# Patient Record
Sex: Female | Born: 1995 | Race: Black or African American | Hispanic: No | Marital: Single | Smoking: Current every day smoker
Health system: Southern US, Community
[De-identification: ages and names within clinical notes are randomized; demographics above are authoritative.]

## PROBLEM LIST (undated history)

## (undated) ENCOUNTER — Inpatient Hospital Stay (HOSPITAL_COMMUNITY): Payer: Self-pay

## (undated) DIAGNOSIS — Z789 Other specified health status: Secondary | ICD-10-CM

## (undated) HISTORY — PX: NO PAST SURGERIES: SHX2092

---
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
## 2016-03-21 LAB — OB RESULTS CONSOLE RUBELLA ANTIBODY, IGM: Rubella: IMMUNE

## 2016-03-21 LAB — OB RESULTS CONSOLE HEPATITIS B SURFACE ANTIGEN: Hepatitis B Surface Ag: NEGATIVE

## 2016-04-30 ENCOUNTER — Encounter (HOSPITAL_COMMUNITY): Payer: Self-pay

## 2016-04-30 ENCOUNTER — Inpatient Hospital Stay (HOSPITAL_COMMUNITY)
Admission: AD | Admit: 2016-04-30 | Discharge: 2016-04-30 | Disposition: A | Discharge status: Home / Self Care | Payer: Medicaid Other | Source: Ambulatory Visit | Attending: Obstetrics & Gynecology | Admitting: Obstetrics & Gynecology

## 2016-04-30 ENCOUNTER — Inpatient Hospital Stay (HOSPITAL_COMMUNITY): Payer: Medicaid Other

## 2016-04-30 DIAGNOSIS — O26893 Other specified pregnancy related conditions, third trimester: Secondary | ICD-10-CM | POA: Insufficient documentation

## 2016-04-30 DIAGNOSIS — Z3A16 16 weeks gestation of pregnancy: Secondary | ICD-10-CM

## 2016-04-30 DIAGNOSIS — Z3492 Encounter for supervision of normal pregnancy, unspecified, second trimester: Secondary | ICD-10-CM

## 2016-04-30 DIAGNOSIS — B3731 Acute candidiasis of vulva and vagina: Secondary | ICD-10-CM

## 2016-04-30 DIAGNOSIS — O26859 Spotting complicating pregnancy, unspecified trimester: Secondary | ICD-10-CM

## 2016-04-30 DIAGNOSIS — F17210 Nicotine dependence, cigarettes, uncomplicated: Secondary | ICD-10-CM | POA: Insufficient documentation

## 2016-04-30 DIAGNOSIS — B373 Candidiasis of vulva and vagina: Secondary | ICD-10-CM | POA: Insufficient documentation

## 2016-04-30 DIAGNOSIS — O26852 Spotting complicating pregnancy, second trimester: Secondary | ICD-10-CM | POA: Diagnosis not present

## 2016-04-30 DIAGNOSIS — Z3A15 15 weeks gestation of pregnancy: Secondary | ICD-10-CM | POA: Insufficient documentation

## 2016-04-30 DIAGNOSIS — Z6790 Unspecified blood type, Rh positive: Secondary | ICD-10-CM | POA: Diagnosis not present

## 2016-04-30 DIAGNOSIS — O98812 Other maternal infectious and parasitic diseases complicating pregnancy, second trimester: Secondary | ICD-10-CM | POA: Insufficient documentation

## 2016-04-30 DIAGNOSIS — O99332 Smoking (tobacco) complicating pregnancy, second trimester: Secondary | ICD-10-CM | POA: Diagnosis not present

## 2016-04-30 DIAGNOSIS — O209 Hemorrhage in early pregnancy, unspecified: Secondary | ICD-10-CM | POA: Diagnosis present

## 2016-04-30 HISTORY — DX: Other specified health status: Z78.9

## 2016-04-30 LAB — CBC
HEMATOCRIT: 33.7 % — AB (ref 36.0–46.0)
HEMOGLOBIN: 11.4 g/dL — AB (ref 12.0–15.0)
MCH: 30.5 pg (ref 26.0–34.0)
MCHC: 33.8 g/dL (ref 30.0–36.0)
MCV: 90.1 fL (ref 78.0–100.0)
Platelets: 260 10*3/uL (ref 150–400)
RBC: 3.74 MIL/uL — ABNORMAL LOW (ref 3.87–5.11)
RDW: 14.1 % (ref 11.5–15.5)
WBC: 11.8 10*3/uL — ABNORMAL HIGH (ref 4.0–10.5)

## 2016-04-30 LAB — URINALYSIS, ROUTINE W REFLEX MICROSCOPIC
Bilirubin Urine: NEGATIVE
Glucose, UA: NEGATIVE mg/dL
Hgb urine dipstick: NEGATIVE
Ketones, ur: NEGATIVE mg/dL
Nitrite: NEGATIVE
Protein, ur: NEGATIVE mg/dL
Specific Gravity, Urine: 1.02 (ref 1.005–1.030)
pH: 7 (ref 5.0–8.0)

## 2016-04-30 LAB — URINE MICROSCOPIC-ADD ON

## 2016-04-30 LAB — RAPID URINE DRUG SCREEN, HOSP PERFORMED
Amphetamines: NOT DETECTED
BARBITURATES: NOT DETECTED
BENZODIAZEPINES: NOT DETECTED
COCAINE: NOT DETECTED
Opiates: NOT DETECTED
TETRAHYDROCANNABINOL: POSITIVE — AB

## 2016-04-30 LAB — WET PREP, GENITAL
Clue Cells Wet Prep HPF POC: NONE SEEN
SPERM: NONE SEEN
Trich, Wet Prep: NONE SEEN

## 2016-04-30 LAB — POCT PREGNANCY, URINE: PREG TEST UR: POSITIVE — AB

## 2016-04-30 MED ORDER — TERCONAZOLE 0.4 % VA CREA: 1.0000 | TOPICAL_CREAM | Freq: Every day | VAGINAL | 0 refills | Status: DC

## 2016-04-30 NOTE — MAU Provider Note (Signed)
History     CSN: [id number]  Arrival date and time: 04/30/16 1914   None     Chief Complaint  Patient presents with  . Vaginal Bleeding   G1P0000 @15 .6 c/o pink spotting since yesterday. She reports IC the night before. She also c/o intermittent lower abdominal pain x3 days. She describes as feeling heaviness and rates pain 7/10. Activity brings on the pain and resting improves the pain. She took Tylenol and had some relief. She denies urinary sx. She denies vaginal discharge, itching, and odor. She had 2 prenatal visits in [place], last one being in August, and reports +chlamydia for which she was treated. She plans to stay here in [place] for remainder of pregnancy.     OB History    Gravida Para Term Preterm AB Living   1             SAB TAB Ectopic Multiple Live Births                  Past Medical History:  Diagnosis Date  . Medical history non-contributory     Past Surgical History:  Procedure Laterality Date  . NO PAST SURGERIES      No family history on file.  Social History  Substance Use Topics  . Smoking status: Current Every Day Smoker    Packs/day: 0.50  . Smokeless tobacco: Former NeurosurgeonUser  . Alcohol use No    Allergies:  Allergies  Allergen Reactions  . Chocolate   . Peanut-Containing Drug Products   . Strawberry Extract   . Tomato     No prescriptions prior to admission.    Review of Systems  Constitutional: Negative.   Gastrointestinal: Positive for abdominal pain.  Genitourinary: Negative.    Physical Exam   Blood pressure 104/55, pulse 80, temperature 97.9 F (36.6 C), temperature source Oral, resp. rate 18, last menstrual period 01/07/2016.  Physical Exam  Constitutional: She is oriented to person, place, and time. She appears well-developed and well-nourished.  HENT:  Head: Normocephalic and atraumatic.  Neck: Normal range of motion. Neck supple.  Cardiovascular: Normal rate.   Respiratory: Effort normal.  GI: Soft. She exhibits no  distension. There is no tenderness.  gravid  Genitourinary:  Genitourinary Comments: External: no lesions Vagina: rugated, nulli, white thick clumpy discharge SVE: closed/long  Musculoskeletal: Normal range of motion.  Neurological: She is alert and oriented to person, place, and time.  Skin: Skin is warm and dry.  Psychiatric: She has a normal mood and affect.   FHT: 152 bpm Results for orders placed or performed during the hospital encounter of 04/30/16 (from the past 24 hour(s))  Urinalysis, Routine w reflex microscopic (not at [facility])     Status: Abnormal   Collection Time: 04/30/16  7:45 PM  Result Value Ref Range   Color, Urine YELLOW YELLOW   APPearance CLEAR CLEAR   Specific Gravity, Urine 1.020 1.005 - 1.030   pH 7.0 5.0 - 8.0   Glucose, UA NEGATIVE NEGATIVE mg/dL   Hgb urine dipstick NEGATIVE NEGATIVE   Bilirubin Urine NEGATIVE NEGATIVE   Ketones, ur NEGATIVE NEGATIVE mg/dL   Protein, ur NEGATIVE NEGATIVE mg/dL   Nitrite NEGATIVE NEGATIVE   Leukocytes, UA TRACE (A) NEGATIVE  Urine microscopic-add on     Status: Abnormal   Collection Time: 04/30/16  7:45 PM  Result Value Ref Range   Squamous Epithelial / LPF 0-5 (A) NONE SEEN   WBC, UA 0-5 0 - 5 WBC/hpf  RBC / HPF 0-5 0 - 5 RBC/hpf   Bacteria, UA FEW (A) NONE SEEN   Urine-Other AMORPHOUS URATES/PHOSPHATES   Pregnancy, urine POC     Status: Abnormal   Collection Time: 04/30/16  8:06 PM  Result Value Ref Range   Preg Test, Ur POSITIVE (A) NEGATIVE  CBC     Status: Abnormal   Collection Time: 04/30/16  8:19 PM  Result Value Ref Range   WBC 11.8 (H) 4.0 - 10.5 K/uL   RBC 3.74 (L) 3.87 - 5.11 MIL/uL   Hemoglobin 11.4 (L) 12.0 - 15.0 g/dL   HCT 62.1 (L) 30.8 - 65.7 %   MCV 90.1 78.0 - 100.0 fL   MCH 30.5 26.0 - 34.0 pg   MCHC 33.8 30.0 - 36.0 g/dL   RDW 84.6 96.2 - 95.2 %   Platelets 260 150 - 400 K/uL  ABO/Rh     Status: None (Preliminary result)   Collection Time: 04/30/16  8:19 PM  Result Value Ref Range    ABO/RH(D) A POS   Wet prep, genital     Status: Abnormal   Collection Time: 04/30/16  8:40 PM  Result Value Ref Range   Yeast Wet Prep HPF POC PRESENT (A) NONE SEEN   Trich, Wet Prep NONE SEEN NONE SEEN   Clue Cells Wet Prep HPF POC NONE SEEN NONE SEEN   WBC, Wet Prep HPF POC FEW (A) NONE SEEN   Sperm NONE SEEN    [place]: posterior placenta, nml AFV, cervix closed MAU Course  Procedures  MDM [place] and [place] ordered and reviewed. No evidence of PTL, UTI or infection. Will treat YV. Pain likely physiologic to pregnancy. Spotting likely post-coital or from yeast. Stable for discharge home.  Assessment and Plan   1. Yeast vaginitis   2. Spotting affecting pregnancy in second trimester   3. Second trimester pregnancy   4. Spotting in pregnancy   5. Rh(D) positive    Discharge home Follow up with OB/GYN provider of choice-list provided Return for worsening sx  Donette Larry, CNM 04/30/2016, 8:17 PM

## 2016-04-30 NOTE — MAU Note (Signed)
Pt presents complaining of abdominal pain and vaginal bleeding that started yesterday. States the bleeding is spotting when she wipes that is brown. Last intercourse two days ago. LMP around 01/06/16. Denies other abnormal discharge.

## 2016-04-30 NOTE — Discharge Instructions (Signed)

## 2016-05-01 LAB — ABO/RH: ABO/RH(D): A POS

## 2016-05-02 LAB — GC/CHLAMYDIA PROBE AMP (~~LOC~~) NOT AT ARMC
Chlamydia: NEGATIVE
NEISSERIA GONORRHEA: NEGATIVE

## 2016-06-11 ENCOUNTER — Encounter: Payer: Self-pay | Admitting: Obstetrics and Gynecology

## 2016-06-11 ENCOUNTER — Ambulatory Visit (INDEPENDENT_AMBULATORY_CARE_PROVIDER_SITE_OTHER): Payer: Medicaid Other | Admitting: Obstetrics and Gynecology

## 2016-06-11 DIAGNOSIS — Z3400 Encounter for supervision of normal first pregnancy, unspecified trimester: Secondary | ICD-10-CM

## 2016-06-11 DIAGNOSIS — Z3402 Encounter for supervision of normal first pregnancy, second trimester: Secondary | ICD-10-CM

## 2016-06-11 DIAGNOSIS — Z202 Contact with and (suspected) exposure to infections with a predominantly sexual mode of transmission: Secondary | ICD-10-CM

## 2016-06-11 DIAGNOSIS — Z3490 Encounter for supervision of normal pregnancy, unspecified, unspecified trimester: Secondary | ICD-10-CM | POA: Insufficient documentation

## 2016-06-11 NOTE — Progress Notes (Signed)
Subjective:  Leah Pitts is a 20 y.o. G1P0 at 7365w2d being seen today for [facility] [place]. She has been recieivng OB care in [place]. 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  She desires STD testing today. U/S on 05/01/16 confirmed LMP for dates. She denies any chronic medical problems. This is her first pregnancy. FOB is not involved. She is currently monitored for the following issues for this low-risk pregnancy and has Supervision of normal pregnancy, antepartum and Possible exposure to STD on her problem list.  Patient reports no complaints.  Contractions: Not present. Vag. Bleeding: None.  Movement: Present. Denies leaking of fluid.   The following portions of the patient's history were reviewed and updated as appropriate: allergies, current medications, past family history, past medical history, past social history, past surgical history and problem list. Problem list updated.  Objective:   Vitals:   06/11/16 1344  BP: 95/65  Pulse: 76  Temp: 98.5 F (36.9 C)  Weight: 141 lb 9.6 oz (64.2 kg)    Fetal Status: Fetal Heart Rate (bpm): 141   Movement: Present     General:  Alert, oriented and cooperative. Patient is in no acute distress.  Skin: Skin is warm and dry. No rash noted.   Cardiovascular: Normal heart rate noted  Respiratory: Normal respiratory effort, no problems with respiration noted  Abdomen: Soft, gravid, appropriate for gestational age. Pain/Pressure: Absent     Pelvic:  Cervical exam deferred        Extremities: Normal range of motion.  Edema: Trace  Mental Status: Normal mood and affect. Normal behavior. Normal judgment and thought content.   Urinalysis:      Assessment and Plan:  Pregnancy: G1P0 at 6865w2d  1. Supervision of normal first pregnancy, antepartum Declines flu vaccine Quad screen today U/S for anantomy  2. Possible exposure to STD STD testing today  Preterm labor symptoms and general obstetric precautions including but not  limited to vaginal bleeding, contractions, leaking of fluid and fetal movement were reviewed in detail with the patient. Please refer to After Visit Summary for other counseling recommendations.  No Follow-up on file.   Hermina StaggersMichael L Arthur Aydelotte, MD

## 2016-06-11 NOTE — Patient Instructions (Signed)
Second Trimester of Pregnancy The second trimester is from week 13 through week 28 (months 4 through 6). The second trimester is often a time when you feel your best. Your body has also adjusted to being pregnant, and you begin to feel better physically. Usually, morning sickness has lessened or quit completely, you may have more energy, and you may have an increase in appetite. The second trimester is also a time when the fetus is growing rapidly. At the end of the sixth month, the fetus is about 9 inches long and weighs about 1 pounds. You will likely begin to feel the baby move (quickening) between 18 and 20 weeks of the pregnancy. Body changes during your second trimester Your body continues to go through many changes during your second trimester. The changes vary from woman to woman.  Your weight will continue to increase. You will notice your lower abdomen bulging out.  You may begin to get stretch marks on your hips, abdomen, and breasts.  You may develop headaches that can be relieved by medicines. The medicines should be approved by your health care provider.  You may urinate more often because the fetus is pressing on your bladder.  You may develop or continue to have heartburn as a result of your pregnancy.  You may develop constipation because certain hormones are causing the muscles that push waste through your intestines to slow down.  You may develop hemorrhoids or swollen, bulging veins (varicose veins).  You may have back pain. This is caused by:  Weight gain.  Pregnancy hormones that are relaxing the joints in your pelvis.  A shift in weight and the muscles that support your balance.  Your breasts will continue to grow and they will continue to become tender.  Your gums may bleed and may be sensitive to brushing and flossing.  Dark spots or blotches (chloasma, mask of pregnancy) may develop on your face. This will likely fade after the baby is born.  A dark line  from your belly button to the pubic area (linea nigra) may appear. This will likely fade after the baby is born.  You may have changes in your hair. These can include thickening of your hair, rapid growth, and changes in texture. Some women also have hair loss during or after pregnancy, or hair that feels dry or thin. Your hair will most likely return to normal after your baby is born. What to expect at prenatal visits During a routine prenatal visit:  You will be weighed to make sure you and the fetus are growing normally.  Your blood pressure will be taken.  Your abdomen will be measured to track your baby's growth.  The fetal heartbeat will be listened to.  Any test results from the previous visit will be discussed. Your health care provider may ask you:  How you are feeling.  If you are feeling the baby move.  If you have had any abnormal symptoms, such as leaking fluid, bleeding, severe headaches, or abdominal cramping.  If you are using any tobacco products, including cigarettes, chewing tobacco, and electronic cigarettes.  If you have any questions. Other tests that may be performed during your second trimester include:  Blood tests that check for:  Low iron levels (anemia).  Gestational diabetes (between 24 and 28 weeks).  Rh antibodies. This is to check for a protein on red blood cells (Rh factor).  Urine tests to check for infections, diabetes, or protein in the urine.  An ultrasound to   confirm the proper growth and development of the baby.  An amniocentesis to check for possible genetic problems.  Fetal screens for spina bifida and Down syndrome.  HIV (human immunodeficiency virus) testing. Routine prenatal testing includes screening for HIV, unless you choose not to have this test. Follow these instructions at home: Eating and drinking  Continue to eat regular, healthy meals.  Avoid raw meat, uncooked cheese, cat litter boxes, and soil used by cats. These  carry germs that can cause birth defects in the baby.  Take your prenatal vitamins.  Take 1500-2000 mg of calcium daily starting at the 20th week of pregnancy until you deliver your baby.  If you develop constipation:  Take over-the-counter or prescription medicines.  Drink enough fluid to keep your urine clear or pale yellow.  Eat foods that are high in fiber, such as fresh fruits and vegetables, whole grains, and beans.  Limit foods that are high in fat and processed sugars, such as fried and sweet foods. Activity  Exercise only as directed by your health care provider. Experiencing uterine cramps is a good sign to stop exercising.  Avoid heavy lifting, wear low heel shoes, and practice good posture.  Wear your seat belt at all times when driving.  Rest with your legs elevated if you have leg cramps or low back pain.  Wear a good support bra for breast tenderness.  Do not use hot tubs, steam rooms, or saunas. Lifestyle  Avoid all smoking, herbs, alcohol, and unprescribed drugs. These chemicals affect the formation and growth of the baby.  Do not use any products that contain nicotine or tobacco, such as cigarettes and e-cigarettes. If you need help quitting, ask your health care provider.  A sexual relationship may be continued unless your health care provider directs you otherwise. General instructions  Follow your health care provider's instructions regarding medicine use. There are medicines that are either safe or unsafe to take during pregnancy.  Take warm sitz baths to soothe any pain or discomfort caused by hemorrhoids. Use hemorrhoid cream if your health care provider approves.  If you develop varicose veins, wear support hose. Elevate your feet for 15 minutes, 3-4 times a day. Limit salt in your diet.  Visit your dentist if you have not gone yet during your pregnancy. Use a soft toothbrush to brush your teeth and be gentle when you floss.  Keep all follow-up  prenatal visits as told by your health care provider. This is important. Contact a health care provider if:  You have dizziness.  You have mild pelvic cramps, pelvic pressure, or nagging pain in the abdominal area.  You have persistent nausea, vomiting, or diarrhea.  You have a bad smelling vaginal discharge.  You have pain with urination. Get help right away if:  You have a fever.  You are leaking fluid from your vagina.  You have spotting or bleeding from your vagina.  You have severe abdominal cramping or pain.  You have rapid weight gain or weight loss.  You have shortness of breath with chest pain.  You notice sudden or extreme swelling of your face, hands, ankles, feet, or legs.  You have not felt your baby move in over an hour.  You have severe headaches that do not go away with medicine.  You have vision changes. Summary  The second trimester is from week 13 through week 28 (months 4 through 6). It is also a time when the fetus is growing rapidly.  Your body goes   through many changes during pregnancy. The changes vary from woman to woman.  Avoid all smoking, herbs, alcohol, and unprescribed drugs. These chemicals affect the formation and growth your baby.  Do not use any tobacco products, such as cigarettes, chewing tobacco, and e-cigarettes. If you need help quitting, ask your health care provider.  Contact your health care provider if you have any questions. Keep all prenatal visits as told by your health care provider. This is important. This information is not intended to replace advice given to you by your health care provider. Make sure you discuss any questions you have with your health care provider. Document Released: 06/24/2001 Document Revised: 12/06/2015 Document Reviewed: 08/31/2012 Elsevier Interactive Patient Education  2017 [employer]  

## 2016-06-11 NOTE — Progress Notes (Signed)
Patient states that she feels good today, reports good fetal movement. 

## 2016-06-13 ENCOUNTER — Encounter: Payer: Self-pay | Admitting: Obstetrics and Gynecology

## 2016-06-13 DIAGNOSIS — R768 Other specified abnormal immunological findings in serum: Secondary | ICD-10-CM | POA: Insufficient documentation

## 2016-06-13 LAB — AFP, QUAD SCREEN
DIA Mom Value: 0.89
DIA Value (EIA): 204.45 pg/mL
DSR (By Age)    1 IN: 1158
DSR (SECOND TRIMESTER) 1 IN: 10000
Gestational Age: 21.7 WEEKS
MSAFP MOM: 1.12
MSAFP: 88.9 ng/mL
MSHCG Mom: 0.64
MSHCG: 15128 m[IU]/mL
Maternal Age At EDD: 20.4 YEARS
Osb Risk: 10000
TEST RESULTS AFP: NEGATIVE
UE3 MOM: 1.83
WEIGHT: 141 [lb_av]
uE3 Value: 4.29 ng/mL

## 2016-06-13 LAB — HSV(HERPES SIMPLEX VRS) I + II AB-IGG
HSV 1 Glycoprotein G Ab, IgG: <0.91 index (ref 0.00–0.90)
HSV 2 Glycoprotein G Ab, IgG: 11.4 index — ABNORMAL HIGH (ref 0.00–0.90)

## 2016-06-14 LAB — CHLAMYDIA/GONOCOCCUS/TRICHOMONAS, NAA
CHLAMYDIA BY NAA: NEGATIVE
GONOCOCCUS BY NAA: NEGATIVE
Trich vag by NAA: NEGATIVE

## 2016-06-20 ENCOUNTER — Ambulatory Visit (HOSPITAL_COMMUNITY)
Admission: RE | Admit: 2016-06-20 | Discharge: 2016-06-20 | Disposition: A | Discharge status: Home / Self Care | Payer: Medicaid Other | Source: Ambulatory Visit | Attending: Obstetrics and Gynecology | Admitting: Obstetrics and Gynecology

## 2016-06-20 DIAGNOSIS — O99012 Anemia complicating pregnancy, second trimester: Secondary | ICD-10-CM | POA: Insufficient documentation

## 2016-06-20 DIAGNOSIS — D571 Sickle-cell disease without crisis: Secondary | ICD-10-CM | POA: Insufficient documentation

## 2016-06-20 DIAGNOSIS — Z3400 Encounter for supervision of normal first pregnancy, unspecified trimester: Secondary | ICD-10-CM

## 2016-06-20 DIAGNOSIS — Z3A23 23 weeks gestation of pregnancy: Secondary | ICD-10-CM | POA: Insufficient documentation

## 2016-07-03 ENCOUNTER — Inpatient Hospital Stay (HOSPITAL_COMMUNITY)
Admission: AD | Admit: 2016-07-03 | Discharge: 2016-07-03 | Disposition: A | Discharge status: Home / Self Care | Payer: Medicaid Other | Source: Ambulatory Visit | Attending: Obstetrics & Gynecology | Admitting: Obstetrics & Gynecology

## 2016-07-03 ENCOUNTER — Encounter (HOSPITAL_COMMUNITY): Payer: Self-pay

## 2016-07-03 DIAGNOSIS — Z87891 Personal history of nicotine dependence: Secondary | ICD-10-CM | POA: Insufficient documentation

## 2016-07-03 DIAGNOSIS — N930 Postcoital and contact bleeding: Secondary | ICD-10-CM

## 2016-07-03 DIAGNOSIS — A6000 Herpesviral infection of urogenital system, unspecified: Secondary | ICD-10-CM | POA: Diagnosis not present

## 2016-07-03 DIAGNOSIS — O4692 Antepartum hemorrhage, unspecified, second trimester: Secondary | ICD-10-CM | POA: Diagnosis not present

## 2016-07-03 DIAGNOSIS — O98312 Other infections with a predominantly sexual mode of transmission complicating pregnancy, second trimester: Secondary | ICD-10-CM | POA: Insufficient documentation

## 2016-07-03 DIAGNOSIS — Z3A25 25 weeks gestation of pregnancy: Secondary | ICD-10-CM | POA: Insufficient documentation

## 2016-07-03 LAB — URINALYSIS, MICROSCOPIC (REFLEX)

## 2016-07-03 LAB — URINALYSIS, ROUTINE W REFLEX MICROSCOPIC
Bilirubin Urine: NEGATIVE
Glucose, UA: NEGATIVE mg/dL
Ketones, ur: NEGATIVE mg/dL
NITRITE: NEGATIVE
PH: 7.5 (ref 5.0–8.0)
Protein, ur: 30 mg/dL — AB
SPECIFIC GRAVITY, URINE: 1.015 (ref 1.005–1.030)

## 2016-07-03 LAB — GC/CHLAMYDIA PROBE AMP (~~LOC~~) NOT AT ARMC
Chlamydia: NEGATIVE
Neisseria Gonorrhea: NEGATIVE

## 2016-07-03 LAB — WET PREP, GENITAL
Clue Cells Wet Prep HPF POC: NONE SEEN
SPERM: NONE SEEN
Trich, Wet Prep: NONE SEEN
YEAST WET PREP: NONE SEEN

## 2016-07-03 NOTE — MAU Provider Note (Signed)
History     CSN: [id number]  Arrival date and time: 07/03/16 [id number]    First Provider Initiated Contact with Patient 07/03/16 [phone]      Chief Complaint  Patient presents with  . Vaginal Bleeding   Patient is a 20 year old G1P0 at 25+3 who presents with complaint of vaginal bleeding. Around 0020, Experienced heavy bleeding when she got up to use the restroom. Felt an initial sharp pain when she got up, but it resolved. Had intercourse around 8-9pm. Reports intercourse was painful.  Last intercourse was 3-4 months ago, and she had bleeding then as well. But this time there was significantly more blood. No LOF, no contractions, good fetal movement.      OB History    Gravida Para Term Preterm AB Living   1             SAB TAB Ectopic Multiple Live Births                  Past Medical History:  Diagnosis Date  . Medical history non-contributory     Past Surgical History:  Procedure Laterality Date  . NO PAST SURGERIES      Family History  Problem Relation Age of Onset  . Diabetes Mother   . Hypertension Mother   . Diabetes Father   . Hypertension Father   . Diabetes Sister   . Hypertension Sister     Social History  Substance Use Topics  . Smoking status: Former Smoker    Packs/day: 0.50    Quit date: 05/13/2016  . Smokeless tobacco: Former NeurosurgeonUser  . Alcohol use No    Allergies:  Allergies  Allergen Reactions  . Peanut-Containing Drug Products Anaphylaxis  . Chocolate Rash  . Tomato Rash    Prescriptions Prior to Admission  Medication Sig Dispense Refill Last Dose  . acetaminophen (TYLENOL) 500 MG tablet Take 1,000 mg by mouth every 6 (six) hours as needed for mild pain, moderate pain or headache.   04/29/2016 at 1800  . cholecalciferol (VITAMIN D) 1000 units tablet Take 1,000 Units by mouth daily.   Taking  . Prenatal Vit-Fe Fumarate-FA (PRENATAL MULTIVITAMIN) TABS tablet Take 1 tablet by mouth daily.   Taking  . terconazole (TERAZOL 7) 0.4 % vaginal  cream Place 1 applicator vaginally at bedtime. 45 g 0     Review of Systems  Constitutional: Negative for chills and fever.  Genitourinary: Negative for dysuria.  Endo/Heme/Allergies: Does not bruise/bleed easily.   Physical Exam   Blood pressure 119/66, pulse 88, temperature 98.1 F (36.7 C), temperature source Oral, resp. rate 18, last menstrual period 01/07/2016.  Physical Exam  Constitutional: She appears well-developed and well-nourished.  Respiratory: Effort normal.  Genitourinary: Cervix exhibits friability.  Genitourinary Comments: Cervix closed and long    MAU Course  Procedures -silver nitrate swab  MDM   Assessment and Plan  Patient is a 20 year old G1P0 at 25+3 who presents with complaint of vaginal bleeding. Denied abdominal pain, contractions, or LOF. Speculum exam demonstrated clear cervical bleeding and only physiological discharge from os. Silver nitrate swab was applied. Patient advised to avoid intercourse for at least 1 week, and use caution when she does resume intercourse.  Clearance Cootsndrew Tyson 07/03/2016, 2:08 AM   I was present for and assisted with the exam and agree with above. Discussed Hx, exam, w/ Dr. Despina HiddenEure who agrees w/ POC. FHR reassuring for gestational age. No UC's.   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 Had requested STD testing. No Hx of genital lesions or other lesions C/W HSV. Informed pt of results. Recommend Valtrex at 34-36 weeks.   [place] Yuleimy Kretz, CNM 07/03/2016 9:24 AM

## 2016-07-03 NOTE — MAU Note (Signed)
Patient presents with vaginal bleeding that started 40 mins ago. Last had intercourse yesterday.

## 2016-07-03 NOTE — Addendum Note (Signed)
Addended by: Maretta BeesMCGLASHAN, Rosabel Sermeno J on: 07/03/2016 08:24 AM   Modules accepted: Orders

## 2016-07-04 ENCOUNTER — Encounter (HOSPITAL_COMMUNITY): Payer: Self-pay | Admitting: *Deleted

## 2016-07-04 ENCOUNTER — Inpatient Hospital Stay (HOSPITAL_COMMUNITY)
Admission: AD | Admit: 2016-07-04 | Discharge: 2016-07-04 | Disposition: A | Discharge status: Home / Self Care | Payer: Medicaid Other | Source: Ambulatory Visit | Attending: Obstetrics and Gynecology | Admitting: Obstetrics and Gynecology

## 2016-07-04 DIAGNOSIS — Z3A25 25 weeks gestation of pregnancy: Secondary | ICD-10-CM | POA: Insufficient documentation

## 2016-07-04 DIAGNOSIS — O209 Hemorrhage in early pregnancy, unspecified: Secondary | ICD-10-CM | POA: Insufficient documentation

## 2016-07-04 DIAGNOSIS — N888 Other specified noninflammatory disorders of cervix uteri: Secondary | ICD-10-CM | POA: Diagnosis not present

## 2016-07-04 DIAGNOSIS — Z87891 Personal history of nicotine dependence: Secondary | ICD-10-CM | POA: Diagnosis not present

## 2016-07-04 LAB — URINALYSIS, ROUTINE W REFLEX MICROSCOPIC
Bilirubin Urine: NEGATIVE
GLUCOSE, UA: NEGATIVE mg/dL
KETONES UR: NEGATIVE mg/dL
NITRITE: NEGATIVE
PH: 6 (ref 5.0–8.0)
Protein, ur: 30 mg/dL — AB
Specific Gravity, Urine: 1.025 (ref 1.005–1.030)

## 2016-07-04 MED ORDER — DOCUSATE SODIUM 100 MG PO CAPS: 100.0000 mg | ORAL_CAPSULE | Freq: Two times a day (BID) | ORAL | 2 refills | Status: DC | PRN

## 2016-07-04 NOTE — Discharge Instructions (Signed)
Vaginal Bleeding During Pregnancy, Second Trimester A small amount of bleeding (spotting) from the vagina is common in pregnancy. Sometimes the bleeding is normal and is not a problem, and sometimes it is a sign of something serious. Be sure to tell your doctor about any bleeding from your vagina right away. Follow these instructions at home:  Watch your condition for any changes.  Follow your doctor's instructions about how active you can be.  If you are on bed rest:  You may need to stay in bed and only get up to use the bathroom.  You may be allowed to do some activities.  If you need help, make plans for someone to help you.  Write down:  The number of pads you use each day.  How often you change pads.  How soaked (saturated) your pads are.  Do not use tampons.  Do not douche.  Do not have sex or orgasms until your doctor says it is okay.  If you pass any tissue from your vagina, save the tissue so you can show it to your doctor.  Only take medicines as told by your doctor.  Do not take aspirin because it can make you bleed.  Do not exercise, lift heavy weights, or do any activities that take a lot of energy and effort unless your doctor says it is okay.  Keep all follow-up visits as told by your doctor. Contact a doctor if:  You bleed from your vagina.  You have cramps.  You have labor pains.  You have a fever that does not go away after you take medicine. Get help right away if:  You have very bad cramps in your back or belly (abdomen).  You have contractions.  You have chills.  You pass large clots or tissue from your vagina.  You bleed more.  You feel light-headed or weak.  You pass out (faint).  You are leaking fluid or have a gush of fluid from your vagina. This information is not intended to replace advice given to you by your health care provider. Make sure you discuss any questions you have with your health care provider. Document  Released: 11/14/2013 Document Revised: 12/06/2015 Document Reviewed: 03/07/2013 Elsevier Interactive Patient Education  2017 Else[employer] Pelvic Rest Introduction Pelvic rest may be recommended if:  Your placenta is partially or completely covering the opening of your cervix (placenta previa).  There is bleeding between the wall of the uterus and the amniotic sac in the first trimester of pregnancy (subchorionic hemorrhage).  You went into labor too early (preterm labor). Based on your overall health and the health of your baby, your health care provider will decide if pelvic rest is right for you. How do I rest my pelvis? For as long as told by your health care provider:  Do not have sex, sexual stimulation, or an orgasm.  Do not use tampons. Do not douche. Do not put anything in your vagina.  Do not lift anything that is heavier than 10 lb (4.5 kg).  Avoid activities that take a lot of effort (are strenuous).  Avoid any activity in which your pelvic muscles could become strained. When should I seek medical care? Seek medical care if you have:  Cramping pain in your lower abdomen.  Vaginal discharge.  A low, dull backache.  Regular contractions.  Uterine tightening. When should I seek immediate medical care? Seek immediate medical care if:  You have vaginal bleeding and you are pregnant. This information is  not intended to replace advice given to you by your health care provider. Make sure you discuss any questions you have with your health care provider. Document Released: 10/25/2010 Document Revised: 12/06/2015 Document Reviewed: 01/01/2015  2017 Elsevier

## 2016-07-04 NOTE — MAU Provider Note (Signed)
Chief Complaint:  Vaginal Bleeding   First Provider Initiated Contact with Patient 07/04/16 1453     HPI: Leah Pitts is a 20 y.o. G1P0 at 5225w0dwho presents to maternity admissions reporting seeing drops of blood after BM today.  No pain. Was seen for this yesterday, deemed to be postcoital . She reports good fetal movement, denies LOF, vaginal bleeding, vaginal itching/burning, urinary symptoms, h/a, dizziness, n/v, diarrhea, constipation or fever/chills.  She denies headache, visual changes or RUQ abdominal pain.  Vaginal Bleeding  The patient's primary symptoms include vaginal bleeding. The patient's pertinent negatives include no genital itching, genital lesions, genital odor or pelvic pain. This is a recurrent problem. The current episode started yesterday. The problem occurs rarely. The problem has been resolved. The patient is experiencing no pain. The problem affects both sides. She is pregnant. Pertinent negatives include no back pain, chills, constipation, diarrhea, dysuria, fever, flank pain, nausea or vomiting. The vaginal discharge was bloody. The vaginal bleeding is spotting. She has not been passing clots. She has not been passing tissue. Nothing aggravates the symptoms. She has tried nothing for the symptoms.   RN Note: C/o vaginal bleeding that started this AM; this is the 2nd episode of bleeding this pregnancy; pt states that she was trying to have a bm and she noticed some blood in the toilet Past Medical History: Past Medical History:  Diagnosis Date  . Medical history non-contributory     Past obstetric history: OB History  Gravida Para Term Preterm AB Living  1            SAB TAB Ectopic Multiple Live Births               # Outcome Date GA Lbr Len/2nd Weight Sex Delivery Anes PTL Lv  1 Current               Past Surgical History: Past Surgical History:  Procedure Laterality Date  . NO PAST SURGERIES      Family History: Family History  Problem Relation  Age of Onset  . Diabetes Mother   . Hypertension Mother   . Diabetes Father   . Hypertension Father   . Diabetes Sister   . Hypertension Sister     Social History: Social History  Substance Use Topics  . Smoking status: Former Smoker    Packs/day: 0.50    Quit date: 05/13/2016  . Smokeless tobacco: Former NeurosurgeonUser  . Alcohol use No    Allergies:  Allergies  Allergen Reactions  . Peanut-Containing Drug Products Anaphylaxis  . Chocolate Rash  . Tomato Rash    Meds:  Prescriptions Prior to Admission  Medication Sig Dispense Refill Last Dose  . acetaminophen (TYLENOL) 500 MG tablet Take 1,000 mg by mouth every 6 (six) hours as needed for mild pain, moderate pain or headache.   Past Month at Unknown time  . cholecalciferol (VITAMIN D) 1000 units tablet Take 1,000 Units by mouth daily.   07/04/2016 at Unknown time  . Prenatal Vit-Fe Fumarate-FA (PRENATAL MULTIVITAMIN) TABS tablet Take 1 tablet by mouth daily.   07/04/2016 at Unknown time  . terconazole (TERAZOL 7) 0.4 % vaginal cream Place 1 applicator vaginally at bedtime. (Patient not taking: Reported on 07/04/2016) 45 g 0 Not Taking at Unknown time    I have reviewed patient's Past Medical Hx, Surgical Hx, Family Hx, Social Hx, medications and allergies.   ROS:  Review of Systems  Constitutional: Negative for chills and fever.  Gastrointestinal: Negative  for constipation, diarrhea, nausea and vomiting.  Genitourinary: Positive for vaginal bleeding. Negative for dysuria, flank pain and pelvic pain.  Musculoskeletal: Negative for back pain.   Other systems negative  Physical Exam  Patient Vitals for the past 24 hrs:  BP Temp Temp src Pulse Resp  07/04/16 1439 107/59 97.5 F (36.4 C) Axillary 87 16   Constitutional: Well-developed, well-nourished female in no acute distress.  Cardiovascular: normal rate and rhythm Respiratory: normal effort, clear to auscultation bilaterally GI: Abd soft, non-tender, gravid appropriate  for gestational age.   No rebound or guarding. MS: Extremities nontender, no edema, normal ROM Neurologic: Alert and oriented x 4.  GU: Neg CVAT.  PELVIC EXAM: Cervix pink, visually closed, without lesion, scant bloody discharge, Cervix friable vaginal walls and external genitalia normal  cervix long and closed  FHT:  Baseline 150 , moderate variability,  no decelerations Contractions: Rare   Labs: Results for orders placed or performed during the hospital encounter of 07/04/16 (from the past 24 hour(s))  Urinalysis, Routine w reflex microscopic     Status: Abnormal   Collection Time: 07/04/16  2:25 PM  Result Value Ref Range   Color, Urine YELLOW YELLOW   APPearance CLOUDY (A) CLEAR   Specific Gravity, Urine 1.025 1.005 - 1.030   pH 6.0 5.0 - 8.0   Glucose, UA NEGATIVE NEGATIVE mg/dL   Hgb urine dipstick LARGE (A) NEGATIVE   Bilirubin Urine NEGATIVE NEGATIVE   Ketones, ur NEGATIVE NEGATIVE mg/dL   Protein, ur 30 (A) NEGATIVE mg/dL   Nitrite NEGATIVE NEGATIVE   Leukocytes, UA LARGE (A) NEGATIVE   RBC / HPF 0-5 0 - 5 RBC/hpf   WBC, UA TOO NUMEROUS TO COUNT 0 - 5 WBC/hpf   Bacteria, UA RARE (A) NONE SEEN   Squamous Epithelial / LPF 6-30 (A) NONE SEEN   Mucous PRESENT    --/--/A POS (10/18 2019)  Imaging:  [place] Mfm Ob Comp + 14 Wk  Result Date: 06/20/2016 OBSTETRICAL ULTRASOUND: This exam was performed within a [facility]. The OB US report was generated in the AS system, and faxed to the ordering physician.  This report is available in the [facility] PACS. See the AS Obstetric US report via the Image Link.   MAU Course/MDM: I have ordered labs and reviewed results.  NST reviewed Discussed this friable cervix may continue to bleed at times Discussed pelvic rest   Assessment: 1. Bleeding of cervix     Plan: Discharge home Preterm Labor precautions and fetal kick counts Follow up in Office for prenatal visits and recheck of bleeding Bleeding  precautions  Follow-up Information    [facility] up.   Contact information: [address] [place] [phone]         Encouraged to return here or to other Urgent Care/ED if she develops worsening of symptoms, increase in pain, fever, or other concerning symptoms.  Pt stable at time of discharge.  Wynelle BourgeoisMarie Neleh Muldoon CNM, MSN Certified Nurse-Midwife 07/04/2016 3:19 PM

## 2016-07-04 NOTE — MAU Note (Signed)
C/o vaginal bleeding that started this AM; this is the 2nd episode of bleeding this pregnancy; pt states that she was trying to have a bm and she noticed some blood in the toilet;

## 2016-07-09 ENCOUNTER — Ambulatory Visit (INDEPENDENT_AMBULATORY_CARE_PROVIDER_SITE_OTHER): Payer: Medicaid Other | Admitting: Obstetrics and Gynecology

## 2016-07-09 ENCOUNTER — Other Ambulatory Visit: Payer: Medicaid Other

## 2016-07-09 VITALS — BP 113/77 | HR 67 | Wt 154.2 lb

## 2016-07-09 DIAGNOSIS — R768 Other specified abnormal immunological findings in serum: Secondary | ICD-10-CM

## 2016-07-09 DIAGNOSIS — Z3492 Encounter for supervision of normal pregnancy, unspecified, second trimester: Secondary | ICD-10-CM

## 2016-07-09 DIAGNOSIS — Z3490 Encounter for supervision of normal pregnancy, unspecified, unspecified trimester: Secondary | ICD-10-CM

## 2016-07-09 NOTE — Progress Notes (Signed)
Subjective:  Leah Pitts is a 20 y.o. G1P0 at 6117w5d being seen today for ongoing prenatal care.  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  Patient reports no complaints.  Contractions: Not present. Vag. Bleeding: None.  Movement: Present. Denies leaking of fluid.   The following portions of the patient's history were reviewed and updated as appropriate: allergies, current medications, past family history, past medical history, past social history, past surgical history and problem list. Problem list updated.  Objective:   Vitals:   07/09/16 0816  BP: 113/77  Pulse: 67  Weight: 154 lb 3.2 oz (69.9 kg)    Fetal Status: Fetal Heart Rate (bpm): 146 Fundal Height: 26 cm Movement: Present     General:  Alert, oriented and cooperative. Patient is in no acute distress.  Skin: Skin is warm and dry. No rash noted.   Cardiovascular: Normal heart rate noted  Respiratory: Normal respiratory effort, no problems with respiration noted  Abdomen: Soft, gravid, appropriate for gestational age. Pain/Pressure: Absent     Pelvic:  Cervical exam deferred        Extremities: Normal range of motion.     Mental Status: Normal mood and affect. Normal behavior. Normal judgment and thought content.   Urinalysis: Urine Protein: Negative Urine Glucose: Negative  Assessment and Plan:  Pregnancy: G1P0 at 1617w5d  1. Encounter for supervision of normal pregnancy, antepartum, unspecified gravidity F/U U/S 07/18/16 - Glucose Tolerance, 2 Hours w/1 Hour - CBC - HIV antibody (with reflex) - RPR  2. HSV-2 seropositive Suppressive treatment at 36 weeks  Preterm labor symptoms and general obstetric precautions including but not limited to vaginal bleeding, contractions, leaking of fluid and fetal movement were reviewed in detail with the patient. Please refer to After Visit  Summary for other counseling recommendations.  Return in about 3 weeks (around 07/30/2016) for OB visit.   Hermina StaggersMichael L Ervin, MD

## 2016-07-10 LAB — CBC
Hematocrit: 37.9 % (ref 34.0–46.6)
Hemoglobin: 12.8 g/dL (ref 11.1–15.9)
MCH: 31.3 pg (ref 26.6–33.0)
MCHC: 33.8 g/dL (ref 31.5–35.7)
MCV: 93 fL (ref 79–97)
PLATELETS: 269 10*3/uL (ref 150–379)
RBC: 4.09 x10E6/uL (ref 3.77–5.28)
RDW: 13.3 % (ref 12.3–15.4)
WBC: 11 10*3/uL — AB (ref 3.4–10.8)

## 2016-07-10 LAB — GLUCOSE TOLERANCE, 2 HOURS W/ 1HR
GLUCOSE, 2 HOUR: 87 mg/dL (ref 65–152)
GLUCOSE, FASTING: 77 mg/dL (ref 65–91)
Glucose, 1 hour: 84 mg/dL (ref 65–179)

## 2016-07-10 LAB — RPR: RPR: NONREACTIVE

## 2016-07-10 LAB — HIV ANTIBODY (ROUTINE TESTING W REFLEX): HIV SCREEN 4TH GENERATION: NONREACTIVE

## 2016-07-18 ENCOUNTER — Ambulatory Visit (HOSPITAL_COMMUNITY): Payer: Medicaid Other

## 2016-07-30 ENCOUNTER — Encounter: Payer: Medicaid Other | Admitting: Obstetrics and Gynecology

## 2016-08-06 ENCOUNTER — Other Ambulatory Visit: Payer: Self-pay | Admitting: Obstetrics and Gynecology

## 2016-08-06 ENCOUNTER — Ambulatory Visit (HOSPITAL_COMMUNITY)
Admission: RE | Admit: 2016-08-06 | Discharge: 2016-08-06 | Disposition: A | Discharge status: Home / Self Care | Payer: Medicaid Other | Source: Ambulatory Visit | Attending: Obstetrics and Gynecology | Admitting: Obstetrics and Gynecology

## 2016-08-06 DIAGNOSIS — Z362 Encounter for other antenatal screening follow-up: Secondary | ICD-10-CM | POA: Diagnosis present

## 2016-08-06 DIAGNOSIS — Z3A29 29 weeks gestation of pregnancy: Secondary | ICD-10-CM

## 2016-08-06 DIAGNOSIS — Z363 Encounter for antenatal screening for malformations: Secondary | ICD-10-CM

## 2016-08-06 DIAGNOSIS — Z3400 Encounter for supervision of normal first pregnancy, unspecified trimester: Secondary | ICD-10-CM

## 2016-08-13 ENCOUNTER — Ambulatory Visit (INDEPENDENT_AMBULATORY_CARE_PROVIDER_SITE_OTHER): Payer: Medicaid Other | Admitting: Obstetrics and Gynecology

## 2016-08-13 VITALS — BP 119/71 | HR 86 | Wt 158.0 lb

## 2016-08-13 DIAGNOSIS — Z3403 Encounter for supervision of normal first pregnancy, third trimester: Secondary | ICD-10-CM

## 2016-08-13 DIAGNOSIS — Z3400 Encounter for supervision of normal first pregnancy, unspecified trimester: Secondary | ICD-10-CM

## 2016-08-13 DIAGNOSIS — R768 Other specified abnormal immunological findings in serum: Secondary | ICD-10-CM

## 2016-08-13 NOTE — Progress Notes (Signed)
Subjective:  Leah Pitts is a 21 y.o. G1P0 at 582w5d being seen today for ongoing prenatal care.  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  Patient reports no complaints.  Contractions: Not present. Vag. Bleeding: None.  Movement: Present. Denies leaking of fluid.   The following portions of the patient's history were reviewed and updated as appropriate: allergies, current medications, past family history, past medical history, past social history, past surgical history and problem list. Problem list updated.  Objective:   Vitals:   08/13/16 1355  BP: 119/71  Pulse: 86  Weight: 158 lb (71.7 kg)    Fetal Status:     Movement: Present     General:  Alert, oriented and cooperative. Patient is in no acute distress.  Skin: Skin is warm and dry. No rash noted.   Cardiovascular: Normal heart rate noted  Respiratory: Normal respiratory effort, no problems with respiration noted  Abdomen: Soft, gravid, appropriate for gestational age. Pain/Pressure: Absent     Pelvic:  Cervical exam deferred        Extremities: Normal range of motion.  Edema: Trace  Mental Status: Normal mood and affect. Normal behavior. Normal judgment and thought content.   Urinalysis:      Assessment and Plan:  Pregnancy: G1P0 at 1082w5d  1. Supervision of normal first pregnancy, antepartum Tdap vaccine information provided  2. HSV-2 seropositive Tx at 34-36 wks  Preterm labor symptoms and general obstetric precautions including but not limited to vaginal bleeding, contractions, leaking of fluid and fetal movement were reviewed in detail with the patient. Please refer to After Visit Summary for other counseling recommendations.  Return in about 2 weeks (around 08/27/2016) for OB visit.   Hermina StaggersMichael L Dieter Hane, MD

## 2016-08-28 ENCOUNTER — Ambulatory Visit (INDEPENDENT_AMBULATORY_CARE_PROVIDER_SITE_OTHER): Payer: Medicaid Other | Admitting: Obstetrics & Gynecology

## 2016-08-28 DIAGNOSIS — Z3490 Encounter for supervision of normal pregnancy, unspecified, unspecified trimester: Secondary | ICD-10-CM

## 2016-08-28 DIAGNOSIS — Z3493 Encounter for supervision of normal pregnancy, unspecified, third trimester: Secondary | ICD-10-CM

## 2016-08-28 NOTE — Progress Notes (Signed)
   PRENATAL VISIT NOTE  Subjective:  Leah Pitts is a 21 y.o. G1P0 at 5628w6d being seen today for ongoing prenatal care.  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  Patient reports no complaints.  Contractions: Not present. Vag. Bleeding: None.  Movement: Present. Denies leaking of fluid.   The following portions of the patient's history were reviewed and updated as appropriate: allergies, current medications, past family history, past medical history, past social history, past surgical history and problem list. Problem list updated.  Objective:   Vitals:   08/28/16 1137  BP: 108/64  Pulse: 88  Weight: 165 lb 12.8 oz (75.2 kg)    Fetal Status: Fetal Heart Rate (bpm): 150 Fundal Height: 33 cm Movement: Present     General:  Alert, oriented and cooperative. Patient is in no acute distress.  Skin: Skin is warm and dry. No rash noted.   Cardiovascular: Normal heart rate noted  Respiratory: Normal respiratory effort, no problems with respiration noted  Abdomen: Soft, gravid, appropriate for gestational age. Pain/Pressure: Absent     Pelvic:  Cervical exam deferred        Extremities: Normal range of motion.  Edema: Trace  Mental Status: Normal mood and affect. Normal behavior. Normal judgment and thought content.   Assessment and Plan:  Pregnancy: G1P0 at 728w6d  1. Encounter for supervision of normal pregnancy, antepartum, unspecified gravidity Doing well  Preterm labor symptoms and general obstetric precautions including but not limited to vaginal bleeding, contractions, leaking of fluid and fetal movement were reviewed in detail with the patient. Please refer to After Visit Summary for other counseling recommendations.  Return in about 2 weeks (around 09/11/2016).   Adam PhenixJames G Arnold, MD

## 2016-08-28 NOTE — Patient Instructions (Signed)
Third Trimester of Pregnancy The third trimester is from week 29 through week 40 (months 7 through 9). The third trimester is a time when the unborn baby (fetus) is growing rapidly. At the end of the ninth month, the fetus is about 20 inches in length and weighs 6-10 pounds. Body changes during your third trimester Your body goes through many changes during pregnancy. The changes vary from woman to woman. During the third trimester:  Your weight will continue to increase. You can expect to gain 25-35 pounds (11-16 kg) by the end of the pregnancy.  You may begin to get stretch marks on your hips, abdomen, and breasts.  You may urinate more often because the fetus is moving lower into your pelvis and pressing on your bladder.  You may develop or continue to have heartburn. This is caused by increased hormones that slow down muscles in the digestive tract.  You may develop or continue to have constipation because increased hormones slow digestion and cause the muscles that push waste through your intestines to relax.  You may develop hemorrhoids. These are swollen veins (varicose veins) in the rectum that can itch or be painful.  You may develop swollen, bulging veins (varicose veins) in your legs.  You may have increased body aches in the pelvis, back, or thighs. This is due to weight gain and increased hormones that are relaxing your joints.  You may have changes in your hair. These can include thickening of your hair, rapid growth, and changes in texture. Some women also have hair loss during or after pregnancy, or hair that feels dry or thin. Your hair will most likely return to normal after your baby is born.  Your breasts will continue to grow and they will continue to become tender. A yellow fluid (colostrum) may leak from your breasts. This is the first milk you are producing for your baby.  Your belly button may stick out.  You may notice more swelling in your hands, face, or  ankles.  You may have increased tingling or numbness in your hands, arms, and legs. The skin on your belly may also feel numb.  You may feel short of breath because of your expanding uterus.  You may have more problems sleeping. This can be caused by the size of your belly, increased need to urinate, and an increase in your body's metabolism.  You may notice the fetus "dropping," or moving lower in your abdomen.  You may have increased vaginal discharge.  Your cervix becomes thin and soft (effaced) near your due date. What to expect at prenatal visits You will have prenatal exams every 2 weeks until week 36. Then you will have weekly prenatal exams. During a routine prenatal visit:  You will be weighed to make sure you and the fetus are growing normally.  Your blood pressure will be taken.  Your abdomen will be measured to track your baby's growth.  The fetal heartbeat will be listened to.  Any test results from the previous visit will be discussed.  You may have a cervical check near your due date to see if you have effaced. At around 36 weeks, your health care provider will check your cervix. At the same time, your health care provider will also perform a test on the secretions of the vaginal tissue. This test is to determine if a type of bacteria, Group B streptococcus, is present. Your health care provider will explain this further. Your health care provider may ask you:    What your birth plan is.  How you are feeling.  If you are feeling the baby move.  If you have had any abnormal symptoms, such as leaking fluid, bleeding, severe headaches, or abdominal cramping.  If you are using any tobacco products, including cigarettes, chewing tobacco, and electronic cigarettes.  If you have any questions. Other tests or screenings that may be performed during your third trimester include:  Blood tests that check for low iron levels (anemia).  Fetal testing to check the health,  activity level, and growth of the fetus. Testing is done if you have certain medical conditions or if there are problems during the pregnancy.  Nonstress test (NST). This test checks the health of your baby to make sure there are no signs of problems, such as the baby not getting enough oxygen. During this test, a belt is placed around your belly. The baby is made to move, and its heart rate is monitored during movement. What is false labor? False labor is a condition in which you feel small, irregular tightenings of the muscles in the womb (contractions) that eventually go away. These are called Braxton Hicks contractions. Contractions may last for hours, days, or even weeks before true labor sets in. If contractions come at regular intervals, become more frequent, increase in intensity, or become painful, you should see your health care provider. What are the signs of labor?  Abdominal cramps.  Regular contractions that start at 10 minutes apart and become stronger and more frequent with time.  Contractions that start on the top of the uterus and spread down to the lower abdomen and back.  Increased pelvic pressure and dull back pain.  A watery or bloody mucus discharge that comes from the vagina.  Leaking of amniotic fluid. This is also known as your "water breaking." It could be a slow trickle or a gush. Let your doctor know if it has a color or strange odor. If you have any of these signs, call your health care provider right away, even if it is before your due date. Follow these instructions at home: Eating and drinking  Continue to eat regular, healthy meals.  Do not eat:  Raw meat or meat spreads.  Unpasteurized milk or cheese.  Unpasteurized juice.  Store-made salad.  Refrigerated smoked seafood.  Hot dogs or deli meat, unless they are piping hot.  More than 6 ounces of albacore tuna a week.  Shark, swordfish, king mackerel, or tile fish.  Store-made salads.  Raw  sprouts, such as mung bean or alfalfa sprouts.  Take prenatal vitamins as told by your health care provider.  Take 1000 mg of calcium daily as told by your health care provider.  If you develop constipation:  Take over-the-counter or prescription medicines.  Drink enough fluid to keep your urine clear or pale yellow.  Eat foods that are high in fiber, such as fresh fruits and vegetables, whole grains, and beans.  Limit foods that are high in fat and processed sugars, such as fried and sweet foods. Activity  Exercise only as directed by your health care provider. Healthy pregnant women should aim for 2 hours and 30 minutes of moderate exercise per week. If you experience any pain or discomfort while exercising, stop.  Avoid heavy lifting.  Do not exercise in extreme heat or humidity, or at high altitudes.  Wear low-heel, comfortable shoes.  Practice good posture.  Do not travel far distances unless it is absolutely necessary and only with the approval   of your health care provider.  Wear your seat belt at all times while in a car, on a bus, or on a plane.  Take frequent breaks and rest with your legs elevated if you have leg cramps or low back pain.  Do not use hot tubs, steam rooms, or saunas.  You may continue to have sex unless your health care provider tells you otherwise. Lifestyle  Do not use any products that contain nicotine or tobacco, such as cigarettes and e-cigarettes. If you need help quitting, ask your health care provider.  Do not drink alcohol.  Do not use any medicinal herbs or unprescribed drugs. These chemicals affect the formation and growth of the baby.  If you develop varicose veins:  Wear support pantyhose or compression stockings as told by your healthcare provider.  Elevate your feet for 15 minutes, 3-4 times a day.  Wear a supportive maternity bra to help with breast tenderness. General instructions  Take over-the-counter and prescription  medicines only as told by your health care provider. There are medicines that are either safe or unsafe to take during pregnancy.  Take warm sitz baths to soothe any pain or discomfort caused by hemorrhoids. Use hemorrhoid cream or witch hazel if your health care provider approves.  Avoid cat litter boxes and soil used by cats. These carry germs that can cause birth defects in the baby. If you have a cat, ask someone to clean the litter box for you.  To prepare for the arrival of your baby:  Take prenatal classes to understand, practice, and ask questions about the labor and delivery.  Make a trial run to the hospital.  Visit the hospital and tour the maternity area.  Arrange for maternity or paternity leave through employers.  Arrange for family and friends to take care of pets while you are in the hospital.  Purchase a rear-facing car seat and make sure you know how to install it in your car.  Pack your hospital bag.  Prepare the baby's nursery. Make sure to remove all pillows and stuffed animals from the baby's crib to prevent suffocation.  Visit your dentist if you have not gone during your pregnancy. Use a soft toothbrush to brush your teeth and be gentle when you floss.  Keep all prenatal follow-up visits as told by your health care provider. This is important. Contact a health care provider if:  You are unsure if you are in labor or if your water has broken.  You become dizzy.  You have mild pelvic cramps, pelvic pressure, or nagging pain in your abdominal area.  You have lower back pain.  You have persistent nausea, vomiting, or diarrhea.  You have an unusual or bad smelling vaginal discharge.  You have pain when you urinate. Get help right away if:  You have a fever.  You are leaking fluid from your vagina.  You have spotting or bleeding from your vagina.  You have severe abdominal pain or cramping.  You have rapid weight loss or weight gain.  You have  shortness of breath with chest pain.  You notice sudden or extreme swelling of your face, hands, ankles, feet, or legs.  Your baby makes fewer than 10 movements in 2 hours.  You have severe headaches that do not go away with medicine.  You have vision changes. Summary  The third trimester is from week 29 through week 40, months 7 through 9. The third trimester is a time when the unborn baby (fetus)   is growing rapidly.  During the third trimester, your discomfort may increase as you and your baby continue to gain weight. You may have abdominal, leg, and back pain, sleeping problems, and an increased need to urinate.  During the third trimester your breasts will keep growing and they will continue to become tender. A yellow fluid (colostrum) may leak from your breasts. This is the first milk you are producing for your baby.  False labor is a condition in which you feel small, irregular tightenings of the muscles in the womb (contractions) that eventually go away. These are called Braxton Hicks contractions. Contractions may last for hours, days, or even weeks before true labor sets in.  Signs of labor can include: abdominal cramps; regular contractions that start at 10 minutes apart and become stronger and more frequent with time; watery or bloody mucus discharge that comes from the vagina; increased pelvic pressure and dull back pain; and leaking of amniotic fluid. This information is not intended to replace advice given to you by your health care provider. Make sure you discuss any questions you have with your health care provider. Document Released: 06/24/2001 Document Revised: 12/06/2015 Document Reviewed: 08/31/2012 Elsevier Interactive Patient Education  2017 [employer]  

## 2016-09-15 ENCOUNTER — Other Ambulatory Visit (HOSPITAL_COMMUNITY)
Admission: RE | Admit: 2016-09-15 | Discharge: 2016-09-15 | Disposition: A | Discharge status: Home / Self Care | Payer: Medicaid Other | Source: Ambulatory Visit | Attending: Obstetrics & Gynecology | Admitting: Obstetrics & Gynecology

## 2016-09-15 ENCOUNTER — Ambulatory Visit (INDEPENDENT_AMBULATORY_CARE_PROVIDER_SITE_OTHER): Payer: Medicaid Other | Admitting: Obstetrics & Gynecology

## 2016-09-15 VITALS — BP 121/87 | HR 80 | Wt 165.0 lb

## 2016-09-15 DIAGNOSIS — Z113 Encounter for screening for infections with a predominantly sexual mode of transmission: Secondary | ICD-10-CM | POA: Diagnosis not present

## 2016-09-15 DIAGNOSIS — Z3403 Encounter for supervision of normal first pregnancy, third trimester: Secondary | ICD-10-CM

## 2016-09-15 DIAGNOSIS — Z3400 Encounter for supervision of normal first pregnancy, unspecified trimester: Secondary | ICD-10-CM

## 2016-09-15 DIAGNOSIS — R768 Other specified abnormal immunological findings in serum: Secondary | ICD-10-CM

## 2016-09-15 MED ORDER — VALACYCLOVIR HCL 500 MG PO TABS: 1000.0000 mg | ORAL_TABLET | Freq: Two times a day (BID) | ORAL | 1 refills | Status: AC

## 2016-09-15 NOTE — Progress Notes (Signed)
   PRENATAL VISIT NOTE  Subjective:  Leah Pitts is a 21 y.o. G1P0 at 7268w3d being seen today for ongoing prenatal care.  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  Patient reports no complaints.  Contractions: Not present. Vag. Bleeding: None.  Movement: Present. Denies leaking of fluid.   The following portions of the patient's history were reviewed and updated as appropriate: allergies, current medications, past family history, past medical history, past social history, past surgical history and problem list. Problem list updated.  Objective:   Vitals:   09/15/16 0858  BP: 121/87  Pulse: 80  Weight: 74.8 kg (165 lb)    Fetal Status: Fetal Heart Rate (bpm): 132   Movement: Present     General:  Alert, oriented and cooperative. Patient is in no acute distress.  Skin: Skin is warm and dry. No rash noted.   Cardiovascular: Normal heart rate noted  Respiratory: Normal respiratory effort, no problems with respiration noted  Abdomen: Soft, gravid, appropriate for gestational age. Pain/Pressure: Present     Pelvic:  Cervical exam performed        Extremities: Normal range of motion.     Mental Status: Normal mood and affect. Normal behavior. Normal judgment and thought content.   Assessment and Plan:  Pregnancy: G1P0 at 4568w3d  1. HSV-2 seropositive - start Valtrex   2. Supervision of normal first pregnancy, antepartum  - Strep Gp B NAA - Cervicovaginal ancillary only  Preterm labor symptoms and general obstetric precautions including but not limited to vaginal bleeding, contractions, leaking of fluid and fetal movement were reviewed in detail with the patient. Please refer to After Visit Summary for other counseling recommendations.  Return in about 1 week (around 09/22/2016).   Allie BossierMyra C Jameelah Watts, MD

## 2016-09-15 NOTE — Progress Notes (Signed)
Pt is to start on Valtrex. Pt states she needs Rx sent to pharmacy.

## 2016-09-17 LAB — CERVICOVAGINAL ANCILLARY ONLY
CHLAMYDIA, DNA PROBE: NEGATIVE
NEISSERIA GONORRHEA: NEGATIVE

## 2016-09-17 LAB — STREP GP B NAA: STREP GROUP B AG: POSITIVE — AB

## 2016-09-21 ENCOUNTER — Encounter: Payer: Self-pay | Admitting: Obstetrics & Gynecology

## 2016-09-21 DIAGNOSIS — O99820 Streptococcus B carrier state complicating pregnancy: Secondary | ICD-10-CM | POA: Insufficient documentation

## 2016-09-23 ENCOUNTER — Ambulatory Visit (INDEPENDENT_AMBULATORY_CARE_PROVIDER_SITE_OTHER): Payer: Medicaid Other | Admitting: Certified Nurse Midwife

## 2016-09-23 VITALS — BP 107/70 | HR 90 | Wt 167.6 lb

## 2016-09-23 DIAGNOSIS — Z3403 Encounter for supervision of normal first pregnancy, third trimester: Secondary | ICD-10-CM

## 2016-09-23 DIAGNOSIS — R768 Other specified abnormal immunological findings in serum: Secondary | ICD-10-CM

## 2016-09-23 DIAGNOSIS — Z3400 Encounter for supervision of normal first pregnancy, unspecified trimester: Secondary | ICD-10-CM

## 2016-09-23 DIAGNOSIS — O99820 Streptococcus B carrier state complicating pregnancy: Secondary | ICD-10-CM

## 2016-09-23 NOTE — Progress Notes (Signed)
   PRENATAL VISIT NOTE  Subjective:  Rada HayDoranna M Cairns is a 21 y.o. G1P0 at 630w4d being seen today for ongoing prenatal care.  She is currently monitored for the following issues for this low-risk pregnancy and has Supervision of normal pregnancy, antepartum; HSV-2 seropositive; and GBS (group B Streptococcus carrier), +RV culture, currently pregnant on her problem list.  Patient reports no complaints.  Contractions: Not present. Vag. Bleeding: None.  Movement: Present. Denies leaking of fluid.   The following portions of the patient's history were reviewed and updated as appropriate: allergies, current medications, past family history, past medical history, past social history, past surgical history and problem list. Problem list updated.  Objective:   Vitals:   09/23/16 1030  BP: 107/70  Pulse: 90  Weight: 167 lb 9.6 oz (76 kg)    Fetal Status: Fetal Heart Rate (bpm): 143 Fundal Height: 36 cm Movement: Present     General:  Alert, oriented and cooperative. Patient is in no acute distress.  Skin: Skin is warm and dry. No rash noted.   Cardiovascular: Normal heart rate noted  Respiratory: Normal respiratory effort, no problems with respiration noted  Abdomen: Soft, gravid, appropriate for gestational age. Pain/Pressure: Absent     Pelvic:  Cervical exam deferred        Extremities: Normal range of motion.  Edema: Trace  Mental Status: Normal mood and affect. Normal behavior. Normal judgment and thought content.   Assessment and Plan:  Pregnancy: G1P0 at 4830w4d  1. Supervision of normal first pregnancy, antepartum     Doing well  2. GBS (group B Streptococcus carrier), +RV culture, currently pregnant     PCN for delivery  3. HSV-2 seropositive      No hx of outbreak, on Valtrex suppression.   Preterm labor symptoms and general obstetric precautions including but not limited to vaginal bleeding, contractions, leaking of fluid and fetal movement were reviewed in detail with the  patient. Please refer to After Visit Summary for other counseling recommendations.  Return in about 1 week (around 09/30/2016) for ROB.   Roe Coombsachelle A Jillian Warth, CNM

## 2016-09-23 NOTE — Progress Notes (Signed)
Patient reports good fetal movement, denies contractions. 

## 2016-09-30 ENCOUNTER — Encounter: Payer: Self-pay | Admitting: Certified Nurse Midwife

## 2016-09-30 ENCOUNTER — Ambulatory Visit (INDEPENDENT_AMBULATORY_CARE_PROVIDER_SITE_OTHER): Payer: Medicaid Other | Admitting: Certified Nurse Midwife

## 2016-09-30 VITALS — BP 118/76 | HR 71 | Wt 170.2 lb

## 2016-09-30 DIAGNOSIS — Z3400 Encounter for supervision of normal first pregnancy, unspecified trimester: Secondary | ICD-10-CM

## 2016-09-30 DIAGNOSIS — O99820 Streptococcus B carrier state complicating pregnancy: Secondary | ICD-10-CM

## 2016-09-30 DIAGNOSIS — Z3403 Encounter for supervision of normal first pregnancy, third trimester: Secondary | ICD-10-CM

## 2016-09-30 DIAGNOSIS — R768 Other specified abnormal immunological findings in serum: Secondary | ICD-10-CM

## 2016-09-30 NOTE — Progress Notes (Signed)
   PRENATAL VISIT NOTE  Subjective:  Leah Pitts is a 21 y.o. G1P0 at 6857w4d being seen today for ongoing prenatal care.  She is currently monitored for the following issues for this low-risk pregnancy and has Supervision of normal pregnancy, antepartum; HSV-2 seropositive; and GBS (group B Streptococcus carrier), +RV culture, currently pregnant on her problem list.  Patient reports no complaints.  Contractions: Not present.  .  Movement: Present. Denies leaking of fluid.   The following portions of the patient's history were reviewed and updated as appropriate: allergies, current medications, past family history, past medical history, past social history, past surgical history and problem list. Problem list updated.  Objective:   Vitals:   09/30/16 1116  BP: 118/76  Pulse: 71  Weight: 170 lb 3.2 oz (77.2 kg)    Fetal Status: Fetal Heart Rate (bpm): 130 Fundal Height: 37 cm Movement: Present  Presentation: Vertex  General:  Alert, oriented and cooperative. Patient is in no acute distress.  Skin: Skin is warm and dry. No rash noted.   Cardiovascular: Normal heart rate noted  Respiratory: Normal respiratory effort, no problems with respiration noted  Abdomen: Soft, gravid, appropriate for gestational age. Pain/Pressure: Present     Pelvic:  Cervical exam performed Dilation: 2 Effacement (%): 50 Station: -3  Extremities: Normal range of motion.  Edema: Trace  Mental Status: Normal mood and affect. Normal behavior. Normal judgment and thought content.   Assessment and Plan:  Pregnancy: G1P0 at 6757w4d  1. Supervision of normal first pregnancy, antepartum     Doing well  2. GBS (group B Streptococcus carrier), +RV culture, currently pregnant      PCN for delivery  3. HSV-2 seropositive      Taking Valtrex  Term labor symptoms and general obstetric precautions including but not limited to vaginal bleeding, contractions, leaking of fluid and fetal movement were reviewed in detail  with the patient. Please refer to After Visit Summary for other counseling recommendations.  Return in about 1 week (around 10/07/2016) for ROB.   Roe Coombsachelle A Damary Doland, CNM

## 2016-09-30 NOTE — Progress Notes (Signed)
Patient reports a lot of pressure today and good fetal movement, denies feeling contractions.

## 2016-10-06 ENCOUNTER — Inpatient Hospital Stay (HOSPITAL_COMMUNITY): Payer: Medicaid Other | Admitting: Anesthesiology

## 2016-10-06 ENCOUNTER — Encounter (HOSPITAL_COMMUNITY)
Admission: AD | Disposition: A | Discharge status: Home / Self Care | Payer: Self-pay | Source: Ambulatory Visit | Attending: Obstetrics and Gynecology

## 2016-10-06 ENCOUNTER — Inpatient Hospital Stay (HOSPITAL_COMMUNITY)
Admission: AD | Admit: 2016-10-06 | Discharge: 2016-10-09 | DRG: 765 | Disposition: A | Discharge status: Home / Self Care | Payer: Medicaid Other | Source: Ambulatory Visit | Attending: Obstetrics and Gynecology | Admitting: Obstetrics and Gynecology

## 2016-10-06 ENCOUNTER — Encounter (HOSPITAL_COMMUNITY): Payer: Self-pay

## 2016-10-06 DIAGNOSIS — O411230 Chorioamnionitis, third trimester, not applicable or unspecified: Secondary | ICD-10-CM | POA: Diagnosis present

## 2016-10-06 DIAGNOSIS — O99824 Streptococcus B carrier state complicating childbirth: Secondary | ICD-10-CM | POA: Diagnosis present

## 2016-10-06 DIAGNOSIS — E876 Hypokalemia: Secondary | ICD-10-CM | POA: Diagnosis present

## 2016-10-06 DIAGNOSIS — Z3A38 38 weeks gestation of pregnancy: Secondary | ICD-10-CM

## 2016-10-06 DIAGNOSIS — O134 Gestational [pregnancy-induced] hypertension without significant proteinuria, complicating childbirth: Secondary | ICD-10-CM | POA: Diagnosis present

## 2016-10-06 DIAGNOSIS — A6000 Herpesviral infection of urogenital system, unspecified: Secondary | ICD-10-CM | POA: Diagnosis present

## 2016-10-06 DIAGNOSIS — Z87891 Personal history of nicotine dependence: Secondary | ICD-10-CM

## 2016-10-06 DIAGNOSIS — Z8249 Family history of ischemic heart disease and other diseases of the circulatory system: Secondary | ICD-10-CM | POA: Diagnosis not present

## 2016-10-06 DIAGNOSIS — D649 Anemia, unspecified: Secondary | ICD-10-CM | POA: Diagnosis not present

## 2016-10-06 DIAGNOSIS — O9081 Anemia of the puerperium: Secondary | ICD-10-CM | POA: Diagnosis not present

## 2016-10-06 DIAGNOSIS — O4292 Full-term premature rupture of membranes, unspecified as to length of time between rupture and onset of labor: Secondary | ICD-10-CM | POA: Diagnosis present

## 2016-10-06 DIAGNOSIS — Z833 Family history of diabetes mellitus: Secondary | ICD-10-CM | POA: Diagnosis not present

## 2016-10-06 DIAGNOSIS — O9832 Other infections with a predominantly sexual mode of transmission complicating childbirth: Secondary | ICD-10-CM | POA: Diagnosis present

## 2016-10-06 DIAGNOSIS — Z3493 Encounter for supervision of normal pregnancy, unspecified, third trimester: Secondary | ICD-10-CM | POA: Diagnosis present

## 2016-10-06 LAB — COMPREHENSIVE METABOLIC PANEL
ALBUMIN: 2.6 g/dL — AB (ref 3.5–5.0)
ALK PHOS: 174 U/L — AB (ref 38–126)
ALT: 25 U/L (ref 14–54)
AST: 41 U/L (ref 15–41)
Anion gap: 9 (ref 5–15)
BILIRUBIN TOTAL: 0.8 mg/dL (ref 0.3–1.2)
CALCIUM: 8.7 mg/dL — AB (ref 8.9–10.3)
CO2: 21 mmol/L — ABNORMAL LOW (ref 22–32)
Chloride: 105 mmol/L (ref 101–111)
Creatinine, Ser: 0.75 mg/dL (ref 0.44–1.00)
GFR calc Af Amer: >60 mL/min (ref >60)
GFR calc non Af Amer: >60 mL/min (ref >60)
GLUCOSE: 87 mg/dL (ref 65–99)
Potassium: 3.3 mmol/L — ABNORMAL LOW (ref 3.5–5.1)
Sodium: 135 mmol/L (ref 135–145)
TOTAL PROTEIN: 7 g/dL (ref 6.5–8.1)

## 2016-10-06 LAB — CBC
HCT: 34.9 % — ABNORMAL LOW (ref 36.0–46.0)
HEMOGLOBIN: 11.9 g/dL — AB (ref 12.0–15.0)
MCH: 30.2 pg (ref 26.0–34.0)
MCHC: 34.1 g/dL (ref 30.0–36.0)
MCV: 88.6 fL (ref 78.0–100.0)
Platelets: 252 10*3/uL (ref 150–400)
RBC: 3.94 MIL/uL (ref 3.87–5.11)
RDW: 13.1 % (ref 11.5–15.5)
WBC: 13.8 10*3/uL — AB (ref 4.0–10.5)

## 2016-10-06 LAB — PROTEIN / CREATININE RATIO, URINE
Creatinine, Urine: 60 mg/dL
Protein Creatinine Ratio: 0.15 mg/mg{Cre} (ref 0.00–0.15)
TOTAL PROTEIN, URINE: 9 mg/dL

## 2016-10-06 LAB — TYPE AND SCREEN
ABO/RH(D): A POS
ANTIBODY SCREEN: NEGATIVE

## 2016-10-06 LAB — RPR: RPR Ser Ql: NONREACTIVE

## 2016-10-06 SURGERY — Surgical Case
Anesthesia: Epidural | Wound class: Clean Contaminated

## 2016-10-06 MED ORDER — CLINDAMYCIN PHOSPHATE 900 MG/50ML IV SOLN
900.0000 mg | Freq: Three times a day (TID) | INTRAVENOUS | Status: AC
  Administered 2016-10-06 – 2016-10-07 (×3): 900 mg via INTRAVENOUS
  Filled 2016-10-06 (×3): qty 50

## 2016-10-06 MED ORDER — OXYCODONE HCL 5 MG PO TABS
5.0000 mg | ORAL_TABLET | ORAL | Status: DC | PRN
  Administered 2016-10-07: 5 mg via ORAL
  Filled 2016-10-06: qty 1

## 2016-10-06 MED ORDER — CHLOROPROCAINE HCL (PF) 3 % IJ SOLN
INTRAMUSCULAR | Status: AC
  Filled 2016-10-06: qty 20

## 2016-10-06 MED ORDER — KETOROLAC TROMETHAMINE 30 MG/ML IJ SOLN: 30.0000 mg | Freq: Four times a day (QID) | INTRAMUSCULAR | Status: AC | PRN

## 2016-10-06 MED ORDER — LIDOCAINE-EPINEPHRINE (PF) 2 %-1:200000 IJ SOLN
INTRAMUSCULAR | Status: AC
  Filled 2016-10-06: qty 20

## 2016-10-06 MED ORDER — PRENATAL MULTIVITAMIN CH
1.0000 | ORAL_TABLET | Freq: Every day | ORAL | Status: DC
  Administered 2016-10-07 – 2016-10-08 (×2): 1 via ORAL
  Filled 2016-10-06 (×2): qty 1

## 2016-10-06 MED ORDER — PENICILLIN G POT IN DEXTROSE 60000 UNIT/ML IV SOLN
3.0000 10*6.[IU] | INTRAVENOUS | Status: DC
  Administered 2016-10-06: 3 10*6.[IU] via INTRAVENOUS
  Filled 2016-10-06 (×3): qty 50

## 2016-10-06 MED ORDER — DIPHENHYDRAMINE HCL 25 MG PO CAPS: 25.0000 mg | ORAL_CAPSULE | Freq: Four times a day (QID) | ORAL | Status: DC | PRN

## 2016-10-06 MED ORDER — POTASSIUM CHLORIDE CRYS ER 20 MEQ PO TBCR
40.0000 meq | EXTENDED_RELEASE_TABLET | Freq: Once | ORAL | Status: AC
  Administered 2016-10-06: 40 meq via ORAL
  Filled 2016-10-06: qty 2

## 2016-10-06 MED ORDER — TERBUTALINE SULFATE 1 MG/ML IJ SOLN: 0.2500 mg | Freq: Once | INTRAMUSCULAR | Status: DC | PRN

## 2016-10-06 MED ORDER — CEFAZOLIN SODIUM-DEXTROSE 2-4 GM/100ML-% IV SOLN: 2.0000 g | INTRAVENOUS | Status: DC

## 2016-10-06 MED ORDER — NALOXONE HCL 0.4 MG/ML IJ SOLN: 0.4000 mg | INTRAMUSCULAR | Status: DC | PRN

## 2016-10-06 MED ORDER — SODIUM BICARBONATE 8.4 % IV SOLN
INTRAVENOUS | Status: DC | PRN
  Administered 2016-10-06: 3 mL via EPIDURAL
  Administered 2016-10-06 (×3): 5 mL via EPIDURAL
  Administered 2016-10-06: 2 mL via EPIDURAL

## 2016-10-06 MED ORDER — DIBUCAINE 1 % RE OINT: 1.0000 "application " | TOPICAL_OINTMENT | RECTAL | Status: DC | PRN

## 2016-10-06 MED ORDER — SENNOSIDES-DOCUSATE SODIUM 8.6-50 MG PO TABS
2.0000 | ORAL_TABLET | ORAL | Status: DC
  Administered 2016-10-06 – 2016-10-08 (×2): 2 via ORAL
  Filled 2016-10-06 (×3): qty 2

## 2016-10-06 MED ORDER — SOD CITRATE-CITRIC ACID 500-334 MG/5ML PO SOLN
30.0000 mL | ORAL | Status: AC
  Administered 2016-10-06: 30 mL via ORAL

## 2016-10-06 MED ORDER — ACETAMINOPHEN 325 MG PO TABS
650.0000 mg | ORAL_TABLET | ORAL | Status: DC | PRN
  Administered 2016-10-06 – 2016-10-08 (×3): 650 mg via ORAL
  Filled 2016-10-06 (×3): qty 2

## 2016-10-06 MED ORDER — SCOPOLAMINE 1 MG/3DAYS TD PT72
1.0000 | MEDICATED_PATCH | Freq: Once | TRANSDERMAL | Status: DC
  Filled 2016-10-06: qty 1

## 2016-10-06 MED ORDER — LIDOCAINE HCL (PF) 1 % IJ SOLN
INTRAMUSCULAR | Status: DC | PRN
  Administered 2016-10-06 (×2): 5 mL

## 2016-10-06 MED ORDER — OXYCODONE HCL 5 MG PO TABS
10.0000 mg | ORAL_TABLET | ORAL | Status: DC | PRN
  Administered 2016-10-06 – 2016-10-09 (×8): 10 mg via ORAL
  Filled 2016-10-06 (×8): qty 2

## 2016-10-06 MED ORDER — SIMETHICONE 80 MG PO CHEW
80.0000 mg | CHEWABLE_TABLET | ORAL | Status: DC
  Administered 2016-10-06 – 2016-10-08 (×2): 80 mg via ORAL
  Filled 2016-10-06 (×3): qty 1

## 2016-10-06 MED ORDER — COCONUT OIL OIL: 1.0000 "application " | TOPICAL_OIL | Status: DC | PRN

## 2016-10-06 MED ORDER — CHLOROPROCAINE HCL (PF) 3 % IJ SOLN
INTRAMUSCULAR | Status: DC | PRN
  Administered 2016-10-06: 20 mL via PERINEURAL

## 2016-10-06 MED ORDER — OXYTOCIN 10 UNIT/ML IJ SOLN
INTRAMUSCULAR | Status: AC
  Filled 2016-10-06: qty 4

## 2016-10-06 MED ORDER — DIPHENHYDRAMINE HCL 50 MG/ML IJ SOLN: 12.5000 mg | INTRAMUSCULAR | Status: DC | PRN

## 2016-10-06 MED ORDER — FENTANYL CITRATE (PF) 100 MCG/2ML IJ SOLN
INTRAMUSCULAR | Status: AC
  Filled 2016-10-06: qty 2

## 2016-10-06 MED ORDER — MEPERIDINE HCL 25 MG/ML IJ SOLN
INTRAMUSCULAR | Status: DC | PRN
  Administered 2016-10-06 (×2): 12.5 mg via INTRAVENOUS

## 2016-10-06 MED ORDER — TETANUS-DIPHTH-ACELL PERTUSSIS 5-2.5-18.5 LF-MCG/0.5 IM SUSP: 0.5000 mL | Freq: Once | INTRAMUSCULAR | Status: DC

## 2016-10-06 MED ORDER — SODIUM CHLORIDE 0.9 % IV SOLN
2.0000 g | Freq: Four times a day (QID) | INTRAVENOUS | Status: DC
  Administered 2016-10-06: 2 g via INTRAVENOUS
  Filled 2016-10-06 (×2): qty 2000

## 2016-10-06 MED ORDER — ONDANSETRON HCL 4 MG/2ML IJ SOLN: 4.0000 mg | Freq: Four times a day (QID) | INTRAMUSCULAR | Status: DC | PRN

## 2016-10-06 MED ORDER — MORPHINE SULFATE (PF) 0.5 MG/ML IJ SOLN
INTRAMUSCULAR | Status: AC
  Filled 2016-10-06: qty 10

## 2016-10-06 MED ORDER — LACTATED RINGERS IV SOLN: 500.0000 mL | Freq: Once | INTRAVENOUS | Status: DC

## 2016-10-06 MED ORDER — PHENYLEPHRINE 40 MCG/ML (10ML) SYRINGE FOR IV PUSH (FOR BLOOD PRESSURE SUPPORT)
PREFILLED_SYRINGE | INTRAVENOUS | Status: AC
  Filled 2016-10-06: qty 10

## 2016-10-06 MED ORDER — NALBUPHINE HCL 10 MG/ML IJ SOLN
5.0000 mg | INTRAMUSCULAR | Status: DC | PRN
  Administered 2016-10-06 (×2): 5 mg via INTRAVENOUS
  Filled 2016-10-06 (×2): qty 1

## 2016-10-06 MED ORDER — MEPERIDINE HCL 25 MG/ML IJ SOLN
INTRAMUSCULAR | Status: AC
  Filled 2016-10-06: qty 1

## 2016-10-06 MED ORDER — SODIUM CHLORIDE 0.9% FLUSH: 3.0000 mL | INTRAVENOUS | Status: DC | PRN

## 2016-10-06 MED ORDER — EPHEDRINE 5 MG/ML INJ: 10.0000 mg | INTRAVENOUS | Status: DC | PRN

## 2016-10-06 MED ORDER — NALBUPHINE HCL 10 MG/ML IJ SOLN
5.0000 mg | Freq: Once | INTRAMUSCULAR | Status: DC | PRN
  Filled 2016-10-06: qty 1

## 2016-10-06 MED ORDER — OXYCODONE-ACETAMINOPHEN 5-325 MG PO TABS: 1.0000 | ORAL_TABLET | ORAL | Status: DC | PRN

## 2016-10-06 MED ORDER — ONDANSETRON HCL 4 MG/2ML IJ SOLN: 4.0000 mg | Freq: Three times a day (TID) | INTRAMUSCULAR | Status: DC | PRN

## 2016-10-06 MED ORDER — SODIUM CHLORIDE 0.9 % IV SOLN
2.0000 g | Freq: Four times a day (QID) | INTRAVENOUS | Status: DC
  Administered 2016-10-06: 2 g via INTRAVENOUS
  Filled 2016-10-06: qty 2000

## 2016-10-06 MED ORDER — OXYTOCIN BOLUS FROM INFUSION: 500.0000 mL | Freq: Once | INTRAVENOUS | Status: DC

## 2016-10-06 MED ORDER — OXYTOCIN 40 UNITS IN LACTATED RINGERS INFUSION - SIMPLE MED
1.0000 m[IU]/min | INTRAVENOUS | Status: DC
  Administered 2016-10-06: 2 m[IU]/min via INTRAVENOUS
  Administered 2016-10-06: 4 m[IU]/min via INTRAVENOUS
  Filled 2016-10-06: qty 1000

## 2016-10-06 MED ORDER — KETOROLAC TROMETHAMINE 30 MG/ML IJ SOLN
INTRAMUSCULAR | Status: AC
  Administered 2016-10-06: 30 mg
  Filled 2016-10-06: qty 1

## 2016-10-06 MED ORDER — DIPHENHYDRAMINE HCL 25 MG PO CAPS: 25.0000 mg | ORAL_CAPSULE | ORAL | Status: DC | PRN

## 2016-10-06 MED ORDER — LACTATED RINGERS IV SOLN
INTRAVENOUS | Status: DC
  Administered 2016-10-06 (×5): via INTRAVENOUS

## 2016-10-06 MED ORDER — GENTAMICIN SULFATE 40 MG/ML IJ SOLN
5.0000 mg/kg | INTRAVENOUS | Status: DC
  Administered 2016-10-06: 310 mg via INTRAVENOUS
  Filled 2016-10-06: qty 7.75

## 2016-10-06 MED ORDER — MORPHINE SULFATE (PF) 0.5 MG/ML IJ SOLN
INTRAMUSCULAR | Status: DC | PRN
  Administered 2016-10-06: 4 mg via EPIDURAL
  Administered 2016-10-06: 1 mg via INTRAVENOUS

## 2016-10-06 MED ORDER — ZOLPIDEM TARTRATE 5 MG PO TABS: 5.0000 mg | ORAL_TABLET | Freq: Every evening | ORAL | Status: DC | PRN

## 2016-10-06 MED ORDER — IBUPROFEN 600 MG PO TABS
600.0000 mg | ORAL_TABLET | Freq: Four times a day (QID) | ORAL | Status: DC
  Administered 2016-10-06 – 2016-10-08 (×7): 600 mg via ORAL
  Filled 2016-10-06 (×9): qty 1

## 2016-10-06 MED ORDER — OXYTOCIN 40 UNITS IN LACTATED RINGERS INFUSION - SIMPLE MED: 2.5000 [IU]/h | INTRAVENOUS | Status: DC

## 2016-10-06 MED ORDER — FENTANYL CITRATE (PF) 100 MCG/2ML IJ SOLN
INTRAMUSCULAR | Status: DC | PRN
  Administered 2016-10-06: 100 ug via INTRAVENOUS

## 2016-10-06 MED ORDER — LACTATED RINGERS IV SOLN: INTRAVENOUS | Status: DC

## 2016-10-06 MED ORDER — LIDOCAINE HCL (PF) 1 % IJ SOLN: 30.0000 mL | INTRAMUSCULAR | Status: DC | PRN

## 2016-10-06 MED ORDER — KETOROLAC TROMETHAMINE 30 MG/ML IJ SOLN: 30.0000 mg | Freq: Once | INTRAMUSCULAR | Status: DC | PRN

## 2016-10-06 MED ORDER — ACETAMINOPHEN 325 MG PO TABS
650.0000 mg | ORAL_TABLET | ORAL | Status: DC | PRN
  Administered 2016-10-06 (×2): 650 mg via ORAL
  Filled 2016-10-06 (×2): qty 2

## 2016-10-06 MED ORDER — NALBUPHINE HCL 10 MG/ML IJ SOLN: 5.0000 mg | INTRAMUSCULAR | Status: DC | PRN

## 2016-10-06 MED ORDER — LACTATED RINGERS IV SOLN
INTRAVENOUS | Status: DC | PRN
  Administered 2016-10-06: 11:00:00 via INTRAVENOUS

## 2016-10-06 MED ORDER — MENTHOL 3 MG MT LOZG: 1.0000 | LOZENGE | OROMUCOSAL | Status: DC | PRN

## 2016-10-06 MED ORDER — SIMETHICONE 80 MG PO CHEW
80.0000 mg | CHEWABLE_TABLET | Freq: Three times a day (TID) | ORAL | Status: DC
  Administered 2016-10-07 – 2016-10-09 (×3): 80 mg via ORAL
  Filled 2016-10-06 (×4): qty 1

## 2016-10-06 MED ORDER — PHENYLEPHRINE 40 MCG/ML (10ML) SYRINGE FOR IV PUSH (FOR BLOOD PRESSURE SUPPORT)
80.0000 ug | PREFILLED_SYRINGE | INTRAVENOUS | Status: DC | PRN
  Filled 2016-10-06: qty 10

## 2016-10-06 MED ORDER — ONDANSETRON HCL 4 MG/2ML IJ SOLN
INTRAMUSCULAR | Status: AC
  Filled 2016-10-06: qty 2

## 2016-10-06 MED ORDER — NALBUPHINE HCL 10 MG/ML IJ SOLN: 5.0000 mg | Freq: Once | INTRAMUSCULAR | Status: DC | PRN

## 2016-10-06 MED ORDER — FENTANYL 2.5 MCG/ML BUPIVACAINE 1/10 % EPIDURAL INFUSION (WH - ANES)
14.0000 mL/h | INTRAMUSCULAR | Status: DC | PRN
  Administered 2016-10-06 (×2): 14 mL/h via EPIDURAL
  Filled 2016-10-06 (×2): qty 100

## 2016-10-06 MED ORDER — SODIUM CHLORIDE 0.9 % IR SOLN
Status: DC | PRN
  Administered 2016-10-06: 1000 mL

## 2016-10-06 MED ORDER — OXYCODONE-ACETAMINOPHEN 5-325 MG PO TABS: 2.0000 | ORAL_TABLET | ORAL | Status: DC | PRN

## 2016-10-06 MED ORDER — OXYTOCIN 40 UNITS IN LACTATED RINGERS INFUSION - SIMPLE MED: 2.5000 [IU]/h | INTRAVENOUS | Status: AC

## 2016-10-06 MED ORDER — OXYTOCIN 10 UNIT/ML IJ SOLN
INTRAMUSCULAR | Status: DC | PRN
  Administered 2016-10-06: 40 [IU] via INTRAVENOUS

## 2016-10-06 MED ORDER — FENTANYL CITRATE (PF) 100 MCG/2ML IJ SOLN: 100.0000 ug | INTRAMUSCULAR | Status: DC | PRN

## 2016-10-06 MED ORDER — NALOXONE HCL 2 MG/2ML IJ SOSY
1.0000 ug/kg/h | PREFILLED_SYRINGE | INTRAVENOUS | Status: DC | PRN
  Filled 2016-10-06: qty 2

## 2016-10-06 MED ORDER — ONDANSETRON HCL 4 MG/2ML IJ SOLN
INTRAMUSCULAR | Status: DC | PRN
  Administered 2016-10-06: 4 mg via INTRAVENOUS

## 2016-10-06 MED ORDER — SODIUM BICARBONATE 8.4 % IV SOLN
INTRAVENOUS | Status: AC
  Filled 2016-10-06: qty 50

## 2016-10-06 MED ORDER — GENTAMICIN SULFATE 40 MG/ML IJ SOLN: 5.0000 mg/kg | INTRAVENOUS | Status: DC

## 2016-10-06 MED ORDER — WITCH HAZEL-GLYCERIN EX PADS: 1.0000 "application " | MEDICATED_PAD | CUTANEOUS | Status: DC | PRN

## 2016-10-06 MED ORDER — PHENYLEPHRINE 40 MCG/ML (10ML) SYRINGE FOR IV PUSH (FOR BLOOD PRESSURE SUPPORT): 80.0000 ug | PREFILLED_SYRINGE | INTRAVENOUS | Status: DC | PRN

## 2016-10-06 MED ORDER — LACTATED RINGERS IV SOLN
500.0000 mL | INTRAVENOUS | Status: DC | PRN
  Administered 2016-10-06 (×2): 250 mL via INTRAVENOUS
  Administered 2016-10-06: 300 mL via INTRAVENOUS

## 2016-10-06 MED ORDER — PENICILLIN G POTASSIUM 5000000 UNITS IJ SOLR
5.0000 10*6.[IU] | Freq: Once | INTRAVENOUS | Status: AC
  Administered 2016-10-06: 5 10*6.[IU] via INTRAVENOUS
  Filled 2016-10-06: qty 5

## 2016-10-06 MED ORDER — SIMETHICONE 80 MG PO CHEW: 80.0000 mg | CHEWABLE_TABLET | ORAL | Status: DC | PRN

## 2016-10-06 MED ORDER — SOD CITRATE-CITRIC ACID 500-334 MG/5ML PO SOLN
30.0000 mL | ORAL | Status: DC | PRN
  Filled 2016-10-06: qty 15

## 2016-10-06 SURGICAL SUPPLY — 38 items
ADH SKN CLS APL DERMABOND .7 (GAUZE/BANDAGES/DRESSINGS) ×1
APL SKNCLS STERI-STRIP NONHPOA (GAUZE/BANDAGES/DRESSINGS) ×1
BENZOIN TINCTURE PRP APPL 2/3 (GAUZE/BANDAGES/DRESSINGS) ×2 IMPLANT
CANISTER SUCT 3000ML PPV (MISCELLANEOUS) ×3 IMPLANT
CHLORAPREP W/TINT 26ML (MISCELLANEOUS) ×3 IMPLANT
CLOSURE WOUND 1/2 X4 (GAUZE/BANDAGES/DRESSINGS) ×1
DERMABOND ADVANCED (GAUZE/BANDAGES/DRESSINGS) ×2
DERMABOND ADVANCED .7 DNX12 (GAUZE/BANDAGES/DRESSINGS) ×1 IMPLANT
DRSG OPSITE POSTOP 4X10 (GAUZE/BANDAGES/DRESSINGS) ×3 IMPLANT
ELECT REM PT RETURN 9FT ADLT (ELECTROSURGICAL) ×3
ELECTRODE REM PT RTRN 9FT ADLT (ELECTROSURGICAL) ×1 IMPLANT
GLOVE BIOGEL PI IND STRL 7.0 (GLOVE) ×2 IMPLANT
GLOVE BIOGEL PI IND STRL 7.5 (GLOVE) ×1 IMPLANT
GLOVE BIOGEL PI INDICATOR 7.0 (GLOVE) ×4
GLOVE BIOGEL PI INDICATOR 7.5 (GLOVE) ×2
GLOVE SKINSENSE NS SZ7.0 (GLOVE) ×2
GLOVE SKINSENSE STRL SZ7.0 (GLOVE) ×1 IMPLANT
GOWN STRL REUS W/ TWL LRG LVL3 (GOWN DISPOSABLE) ×2 IMPLANT
GOWN STRL REUS W/ TWL XL LVL3 (GOWN DISPOSABLE) ×1 IMPLANT
GOWN STRL REUS W/TWL LRG LVL3 (GOWN DISPOSABLE) ×6
GOWN STRL REUS W/TWL XL LVL3 (GOWN DISPOSABLE) ×3
KIT ABG SYR 3ML LUER SLIP (SYRINGE) ×6 IMPLANT
NDL HYPO 25X5/8 SAFETYGLIDE (NEEDLE) IMPLANT
NEEDLE HYPO 25X5/8 SAFETYGLIDE (NEEDLE) ×6 IMPLANT
NS IRRIG 1000ML POUR BTL (IV SOLUTION) ×3 IMPLANT
PACK C SECTION WH (CUSTOM PROCEDURE TRAY) ×3 IMPLANT
PAD OB MATERNITY 4.3X12.25 (PERSONAL CARE ITEMS) ×3 IMPLANT
PAD PREP 24X48 CUFFED NSTRL (MISCELLANEOUS) ×3 IMPLANT
STRIP CLOSURE SKIN 1/2X4 (GAUZE/BANDAGES/DRESSINGS) ×1 IMPLANT
SUT CHROMIC 1 CTX 36 (SUTURE) ×2 IMPLANT
SUT MON AB 4-0 PS1 27 (SUTURE) ×3 IMPLANT
SUT MON AB-0 CT1 36 (SUTURE) ×6 IMPLANT
SUT PLAIN 2 0 (SUTURE) ×6
SUT PLAIN ABS 2-0 CT1 27XMFL (SUTURE) ×2 IMPLANT
SUT VIC AB 0 CT1 36 (SUTURE) ×6 IMPLANT
SUT VIC AB 3-0 CT1 27 (SUTURE) ×3
SUT VIC AB 3-0 CT1 TAPERPNT 27 (SUTURE) ×1 IMPLANT
SUT VIC AB 4-0 KS 27 (SUTURE) ×3 IMPLANT

## 2016-10-06 NOTE — Addendum Note (Signed)
Addendum  created 10/06/16 1626 by Jhonnie GarnerBeth M Lou Loewe, CRNA   Sign clinical note

## 2016-10-06 NOTE — Progress Notes (Addendum)
L&D Note  10/06/2016 - 10:36 AM  20 y.o. G1 [redacted]w[redacted]d. Pregnancy complicated by likely prolonged rupture of membranes (pt unsure but thinks she SROM'ed prior to coming to [facility] for labor evaluation), GBS pos, h/o HSV  Leah Pitts is admitted for early labor. Patient recently diagnosed with IAIf   Subjective:  Comfortable with epidural  Objective:   Vitals:   10/06/16 0930 10/06/16 0932 10/06/16 1002 10/06/16 1014  BP:  (!) 144/81    Pulse:  72    Resp:  18 18   Temp: (!) 101.3 F (38.5 C)   (!) 100.7 F (38.2 C)  TempSrc: Oral   Axillary  SpO2:      Weight:      Height:        Current Vital Signs 24h Vital Sign Ranges  T (!) 100.7 F (38.2 C) Temp  Avg: 99.8 F (37.7 C)  Min: 97.8 F (36.6 C)  Max: 101.3 F (38.5 C)  BP (!) 144/81 BP  Min: 123/81  Max: 159/94  HR 72 Pulse  Avg: 72.4  Min: 68  Max: 79  RR 18 Resp  Avg: 18.8  Min: 18  Max: 20  SaO2 100 %   SpO2  Avg: 99.8 %  Min: 98 %  Max: 100 %       24 Hour I/O Current Shift I/O  Time Ins Outs 03/25 0701 - 03/26 0700 In: -  Out: 650 [Urine:650] 03/26 0701 - 03/26 1900 In: -  Out: 700 [Urine:700]   FHR: 170 baseline, no accels, + late decels, mod variability Toco: q1-75m Gen: NAD SVE: recently checked and 8/100/+1. At 0830 was 7-8/90/0  Labs:   Recent Labs Lab 10/06/16 0131  WBC 13.8*  HGB 11.9*  HCT 34.9*  PLT 252    Recent Labs Lab 10/06/16 0131  NA 135  K 3.3*  CL 105  CO2 21*  BUN <5*  CREATININE 0.75  CALCIUM 8.7*  PROT 7.0  BILITOT 0.8  ALKPHOS 174*  ALT 25  AST 41  GLUCOSE 87    Medications Current Facility-Administered Medications  Medication Dose Route Frequency Provider Last Rate Last Dose  . acetaminophen (TYLENOL) tablet 650 mg  650 mg Oral Q4H PRN Arabella Merles, CNM   650 mg at 10/06/16 1610  . ampicillin (OMNIPEN) 2 g in sodium chloride 0.9 % 50 mL IVPB  2 g Intravenous Q6H [place] Bing, MD      . ceFAZolin (ANCEF) IVPB 2g/100 mL premix  2 g Intravenous 30 min  Pre-Op [place] Bing, MD      . clindamycin (CLEOCIN) IVPB 900 mg  900 mg Intravenous Q8H [place] Bing, MD      . diphenhydrAMINE (BENADRYL) injection 12.5 mg  12.5 mg Intravenous Q15 min PRN Phillips Grout, MD      . ePHEDrine injection 10 mg  10 mg Intravenous PRN Phillips Grout, MD      . ePHEDrine injection 10 mg  10 mg Intravenous PRN Phillips Grout, MD      . fentaNYL (SUBLIMAZE) injection 100 mcg  100 mcg Intravenous Q1H PRN Arabella Merles, CNM      . fentaNYL 2.5 mcg/ml w/bupivacaine 0.1% in NS epidural infusion (WH-ANES)  14 mL/hr Epidural Continuous PRN Phillips Grout, MD 14 mL/hr at 10/06/16 0935 14 mL/hr at 10/06/16 0935  . [START ON 10/07/2016] gentamicin (GARAMYCIN) 310 mg in dextrose 5 % 100 mL IVPB  5 mg/kg (Adjusted) Intravenous Q24H [place]  Bing, MD      .  lactated ringers infusion 500 mL  500 mL Intravenous Once Phillips GroutPeter Carignan, MD      . lactated ringers infusion 500-1,000 mL  500-1,000 mL Intravenous PRN Arabella MerlesKimberly D Shaw, CNM 1,000 mL/hr at 10/06/16 0840 300 mL at 10/06/16 0840  . lactated ringers infusion   Intravenous Continuous Arabella MerlesKimberly D Shaw, CNM 125 mL/hr at 10/06/16 0741    . lidocaine (PF) (XYLOCAINE) 1 % injection 30 mL  30 mL Subcutaneous PRN Arabella MerlesKimberly D Shaw, CNM      . ondansetron (ZOFRAN) injection 4 mg  4 mg Intravenous Q6H PRN Arabella MerlesKimberly D Shaw, CNM      . oxyCODONE-acetaminophen (PERCOCET/ROXICET) 5-325 MG per tablet 1 tablet  1 tablet Oral Q4H PRN Arabella MerlesKimberly D Shaw, CNM      . oxyCODONE-acetaminophen (PERCOCET/ROXICET) 5-325 MG per tablet 2 tablet  2 tablet Oral Q4H PRN Arabella MerlesKimberly D Shaw, CNM      . oxytocin (PITOCIN) IV BOLUS FROM BAG  500 mL Intravenous Once Arabella MerlesKimberly D Shaw, CNM      . oxytocin (PITOCIN) IV infusion 40 units in LR 1000 mL - Premix  2.5 Units/hr Intravenous Continuous Arabella MerlesKimberly D Shaw, CNM      . PHENYLephrine 40 mcg/ml in normal saline Adult IV Push Syringe  80 mcg Intravenous PRN Phillips GroutPeter Carignan, MD      . PHENYLephrine 40 mcg/ml in  normal saline Adult IV Push Syringe  80 mcg Intravenous PRN Phillips GroutPeter Carignan, MD      . sodium citrate-citric acid (ORACIT) solution 30 mL  30 mL Oral Q2H PRN Arabella MerlesKimberly D Shaw, CNM      . terbutaline (BRETHINE) injection 0.25 mg  0.25 mg Subcutaneous Once PRN Arabella MerlesKimberly D Shaw, CNM       Facility-Administered Medications Ordered in Other Encounters  Medication Dose Route Frequency Provider Last Rate Last Dose  . lidocaine (PF) (XYLOCAINE) 1 % injection    Anesthesia Intra-op Phillips GroutPeter Carignan, MD   5 mL at 10/06/16 0308    Assessment & Plan:  Pt stable *IUP: category II tracing. Pt s/p IVF bolus and IV abx and tylenol  *Labor: d/w patient that continuing to be febrile, with category II tracing and cervix essentially the same (different examiners) on repeat SVE, with uterus contracting with adequate frequency on 4mU of pitocin. D/w her that IAI can hinder uterine contraction ability and given her cervix is the same, nullip status, and fetus still category II despite resuscitative measures and she's still febrile, that I recommend proceeding with c-section. Operative risks also d/w patient as well. Patient amenable to proceeding with c-section. wil add clinda and do ancef since has been >1hr since IAI abx. Will d/c pitocin *GBS: pos. Has been on PCN *Analgesia: epidural working well.   Cornelia Copaharlie Harlan Vinal, Jr. MD Attending [facility](Faculty Practice)

## 2016-10-06 NOTE — Progress Notes (Signed)
LABOR PROGRESS NOTE  Subjective: Developed fever and fetal tachycardia.  Feeling ok.  Objective: BP (!) 148/86   Pulse 74   Temp (!) 100.7 F (38.2 C) (Oral)   Resp 18   Ht 5\' 3"  (1.6 m)   Wt 170 lb (77.1 kg)   LMP 01/07/2016 (Approximate)   SpO2 100%   BMI 30.11 kg/m    Dilation: 7.5 Effacement (%): 90 Station: 0 Presentation: Vertex Exam by:: Davina PokeErin Foley, RN   Assessment / Plan: 21 y.o. G1P0 at 6694w3d here for spontaneous labor.  Labor: active; cont augmentation, pit at 4 Triple I:  Fetal tachycardia and maternal temp -> start amp/gent, given APAP Fetal Wellbeing:  Category II:  Majority of time baseline 175, moderate variability, no accels, and prolonged/variable decels.  Reassured by variability. Watching closely/starting abx.  Currently, baseline appears to be 150, w/mod variability, w/accels, and no decels (cat I).  Pain Control:  placed Anticipated MOD:  Anticipate vaginal GBS pos:  s/p pcnx2, switching to amp/gent for triple I Gestational hypertension:  Pressures non-severe, labs wnl; cont to monitor  Charlsie MerlesJulia Rhoden, MD 10/06/2016, 8:55 AM

## 2016-10-06 NOTE — Lactation Note (Addendum)
This note was copied from a baby's chart. Lactation Consultation Note  Patient Name: Leah Pitts ZOXWR'UToday's Date: 10/06/2016 Reason for consult: Initial assessment;NICU baby   Initial consult with first time mom of 3 hour old NICU infant. Mom reports she is planning to breast and formula feed. Mom was drowsy during consult so will need reiteration of teaching.   SET up DEBP with instructions for use on Initiate setting, set up, assembling, disassembling and cleaning of pump parts. Enc mom to pump every 2-3 hours for 15 minutes followed by hand expression. Hand expression was shown to mom and mom returned demonstration. Glistening of colostrum noted to right breast, none to left. Mom with soft compressible breasts with short shaft everted nipples that flatten with areolar compression. Mom was too tired to begin pumping at this time. Enc mom to begin pumping by 6 hours of age.   Providing Milk for Your Baby in NICU Booklet given, Reviewed pumping, what to expect with pumping, Breast milk handling and storage for NICU infant, hand expression and labeling of breast milk for NICU infant. Enc mom to call out for assistance as needed.   Mom without further questions/concerns at this time. [facility] [place] [facility] [place] [facility] [place] [facility].    Maternal Data Formula Feeding for Exclusion: Yes Reason for exclusion: Mother's choice to formula and breast feed on admission Has patient been taught Hand Expression?: Yes Does the patient have breastfeeding experience prior to this delivery?: No  Feeding    LATCH Score/Interventions                      Lactation Tools Discussed/Used WIC Program: Yes Pump Review: Setup, frequency, and cleaning;Milk Storage Initiated by:: Leah StainSharon Renn Stille, RN, IBCLC Date initiated:: 10/06/16   Consult Status Consult Status: Follow-up Date: 10/07/16 Follow-up type: In-patient    Leah Pitts 10/06/2016, 3:08  PM

## 2016-10-06 NOTE — Anesthesia Procedure Notes (Signed)
Epidural Patient location during procedure: OB  Staffing Anesthesiologist: Kaleena Corrow Performed: anesthesiologist   Preanesthetic Checklist Completed: patient identified, site marked, surgical consent, pre-op evaluation, timeout performed, IV checked, risks and benefits discussed and monitors and equipment checked  Epidural Patient position: sitting Prep: DuraPrep Patient monitoring: heart rate, continuous pulse ox and blood pressure Approach: right paramedian Location: L3-L4 Injection technique: LOR saline  Needle:  Needle type: Tuohy  Needle gauge: 17 G Needle length: 9 cm and 9 Needle insertion depth: 6 cm Catheter type: closed end flexible Catheter size: 20 Guage Catheter at skin depth: 10 cm Test dose: negative  Assessment Events: blood not aspirated, injection not painful, no injection resistance, negative IV test and no paresthesia  Additional Notes Patient identified. Risks/Benefits/Options discussed with patient including but not limited to bleeding, infection, nerve damage, paralysis, failed block, incomplete pain control, headache, blood pressure changes, nausea, vomiting, reactions to medication both or allergic, itching and postpartum back pain. Confirmed with bedside nurse the patient's most recent platelet count. Confirmed with patient that they are not currently taking any anticoagulation, have any bleeding history or any family history of bleeding disorders. Patient expressed understanding and wished to proceed. All questions were answered. Sterile technique was used throughout the entire procedure. Please see nursing notes for vital signs. Test dose was given through epidural needle and negative prior to continuing to dose epidural or start infusion. Warning signs of high block given to the patient including shortness of breath, tingling/numbness in hands, complete motor block, or any concerning symptoms with instructions to call for help. Patient was given  instructions on fall risk and not to get out of bed. All questions and concerns addressed with instructions to call with any issues.     

## 2016-10-06 NOTE — Progress Notes (Signed)
Patient ID: Leah Pitts, female   DOB: Oct 11, 1995, 21 y.o.   MRN: [id number]  Comfortable w/ epidural  BP 123/81, T 99.4, other VSS FHR 170-180 x past hour, min variability, no decels, but coming down to 150-160s as I was finishing exam Ctx spacing out Cx 5/100/0- intended to AROM, but no bag felt  IUP@term  Latent labor SROM at unk time? PCN x 2 doses Fetal tachycardia Hypokalemia  Will begin Pit to increase ctx to adequate labor Watch temp- plan on Tylenol and changing abx if T = 100.4 Kdur 40meq   Cam HaiSHAW, Roberth Berling CNM 10/06/2016 7:43 AM

## 2016-10-06 NOTE — Progress Notes (Signed)
Notified of pt arrival in MAU and exam. Will admit to labor and delivery.  

## 2016-10-06 NOTE — Anesthesia Preprocedure Evaluation (Addendum)
Anesthesia Evaluation  Patient identified by MRN, date of birth, ID band Patient awake    Reviewed: Allergy & Precautions, H&P , NPO status , Patient's Chart, lab work & pertinent test results  History of Anesthesia Complications Negative for: history of anesthetic complications  Airway Mallampati: II  TM Distance: >3 FB Neck ROM: full    Dental no notable dental hx. (+) Teeth Intact   Pulmonary neg pulmonary ROS, former smoker,    Pulmonary exam normal breath sounds clear to auscultation       Cardiovascular negative cardio ROS Normal cardiovascular exam Rhythm:regular Rate:Normal     Neuro/Psych negative neurological ROS  negative psych ROS   GI/Hepatic negative GI ROS, Neg liver ROS,   Endo/Other  negative endocrine ROS  Renal/GU negative Renal ROS  negative genitourinary   Musculoskeletal   Abdominal   Peds  Hematology negative hematology ROS (+)   Anesthesia Other Findings   Reproductive/Obstetrics (+) Pregnancy                             Anesthesia Physical Anesthesia Plan  ASA: II  Anesthesia Plan: Epidural   Post-op Pain Management:    Induction:   Airway Management Planned:   Additional Equipment:   Intra-op Plan:   Post-operative Plan:   Informed Consent: I have reviewed the patients History and Physical, chart, labs and discussed the procedure including the risks, benefits and alternatives for the proposed anesthesia with the patient or authorized representative who has indicated his/her understanding and acceptance.     Plan Discussed with: CRNA  Anesthesia Plan Comments:        Anesthesia Quick Evaluation

## 2016-10-06 NOTE — Anesthesia Postprocedure Evaluation (Signed)
Anesthesia Post Note  Patient: Leah Pitts  Procedure(s) Performed: Procedure(s) (LRB): CESAREAN SECTION (N/A)  Patient location during evaluation: PACU Anesthesia Type: Epidural Level of consciousness: sedated and patient cooperative Pain management: pain level controlled Vital Signs Assessment: post-procedure vital signs reviewed and stable Respiratory status: spontaneous breathing Cardiovascular status: stable Anesthetic complications: no        Last Vitals:  Vitals:   10/06/16 1328 10/06/16 1444  BP: 122/72 139/74  Pulse: 71 68  Resp: 18 20  Temp: 37.1 C 36.7 C    Last Pain:  Vitals:   10/06/16 1444  TempSrc: Oral  PainSc:    Pain Goal:                 Lewie LoronJohn Jquan Egelston

## 2016-10-06 NOTE — Op Note (Addendum)
Operative Note   SURGERY DATE: 10/06/2016  PRE-OP DIAGNOSIS:  *Pregnancy @ 38 and 3 wks *Chorioamionitis *Persistent Category II tracing *Lack of cervical change *PPH  POST-OP DIAGNOSIS: same. delivered  PROCEDURE: primary low transverse cesarean section via pfannenstiel skin incision with double layer uterine closure  SURGEON: Surgeon(s) and Role:    * [place] Bingharlie Nafisah Runions, MD  ASSISTANT:    Lorne Skeens* Nicholas Michael Schenk, MD   ANESTHESIA: epidural  ESTIMATED BLOOD LOSS: 1600mL  VTE PROPHYLAXIS: SCDs to bilateral lower extremities  ANTIBIOTICS: clindamycin and gentamycin, within 1 hour of skin incision. Ampicillin had already been given.   SPECIMENS: placenta to pathology, venous and arterial blood gas  COMPLICATIONS: none  FINDINGS: No intra-abdominal adhesions were noted. Grossly normal uterus, tubes and ovaries. clear amniotic fluid, cephalic female infant, weight 3325, APGARs 3/2/7, intact placenta. Large amount of caput.  Results for Willia CrazeWILSON, [facility] (MRN [id number]) as of 10/06/2016 15:46  Ref. Range 10/06/2016 11:48 10/06/2016 11:48  pH cord blood (arterial) Latest Ref Range: 7.210 - 7.380  7.280 7.255  pCO2 cord blood (arterial) Latest Ref Range: 42.0 - 56.0 mmHg 52.5 55.9  Bicarbonate Latest Ref Range: 13.0 - 22.0 mmol/L 23.9 (H) 24.0 (H)    PROCEDURE IN DETAIL: The patient was taken to the operating room where anesthesia was administered and normal fetal heart tones were confirmed. She was then prepped and draped in the normal fashion in the dorsal supine position with a leftward tilt.  After a time out was performed, a pfannensteil  skin incision was made with the scalpel and carried through to the underlying layer of fascia. The fascia was then incised at the midline and this incision was extended laterally with the mayo scissors. Attention was turned to the superior aspect of the fascial incision which was grasped with the kocher clamps x 2, tented up and the rectus muscles  were dissected off with the bovie. In a similar fashion the inferior aspect of the fascial incision was grasped with the kocher clamps, tented up and the rectus muscles dissected off with the mayo scissors. The rectus muscles were then separated in the midline and the peritoneum was entered bluntly. The bladder blade was inserted and the vesicouterine peritoneum was identified, tented up and entered with the metzenbaum scissors. This incision was extended laterally and the bladder flap was created digitally. The bladder blade was reinserted.  A low transverse hysterotomy was made with the scalpel until the endometrial cavity was breached and the amniotic sac ruptured yielding clear amniotic fluid. This incision was extended bluntly and the infant's head, shoulders and body were delivered atraumatically.The cord was clamped x 2 and cut, and the infant was handed to the awaiting pediatricians, after delayed cord clamping was done x 1 minute; the newborn had normal HR and appeared responsive with some respiratory effort so decision made for delayed clampingThe placenta was then gradually expressed from the uterus and then the uterus was exteriorized and cleared of all clots and debris. The hysterotomy was repaired with a running suture of 1-0 moncryl. A second imbricating layer of 1-0 monocryl suture was then placed. A small extension inferiorly on the patients right was repaied with #1 chromic.  The uterus and adnexa were then returned to the abdomen, and the hysterotomy and all operative sites were reinspected and excellent hemostasis was noted after irrigation and suction of the abdomen with warm saline. nesicaine was administered into the peritoneal cavity.   The fascia was reapproximated with 0 Vicryl in a simple  running fashion bilaterally. The subcutaneous layer was then reapproximated with interrupted sutures of 2-0 plain gut, and the skin was then closed with 4-0 monocryl, in a subcuticular  fashion.  The patient  tolerated the procedure well. Sponge, lap, needle, and instrument counts were correct x 2. The patient was transferred to the recovery room awake, alert and breathing independently in stable condition. The patient will receive 2 doses of amp/gent/clinda post operatively.   Ernestina Penna MD [facility] fellow Center for [facility])   Agree with above. I was present and scrubbed for the entire procedure.   Cornelia Copa MD Attending Center for [employer] [facility])

## 2016-10-06 NOTE — H&P (Signed)
Leah Pitts is a 21 y.o. female G1 @38 .3wks presenting for latent labor. She had a cx change in MAU from 3/80 to 4/90. Denies leaking fluid. No H/A, N/V or visual disturbances. 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 Medical history non-contributory    Past Surgical History:  Procedure Laterality Date  . NO PAST SURGERIES     Family History: family history includes Diabetes in her father, mother, and sister; Hypertension in her father, mother, and sister. Social History:  reports that she quit smoking about 4 months ago. She smoked 0.50 packs per day. She has quit using smokeless tobacco. She reports that she does not drink alcohol or use drugs.     Maternal Diabetes: No Genetic Screening: Normal Maternal Ultrasounds/Referrals: Normal Fetal Ultrasounds or other Referrals:  None Maternal Substance Abuse:  No Significant Maternal Medications:  Meds include: Other: Valtrex Significant Maternal Lab Results:  Lab values include: Group B Strep positive Other Comments:  None  ROS History Dilation: 4 Effacement (%): 90 Station: -2 Exam by:: C. Neill RNC Blood pressure (!) 148/88, pulse 74, temperature 97.8 F (36.6 C), temperature source Oral, resp. rate 18, last menstrual period 01/07/2016.  BPs 142/86, 132/81 Exam Physical Exam  Constitutional: She is oriented to person, place, and time. She appears well-developed.  HENT:  Head: Normocephalic.  Neck: Normal range of motion.  Cardiovascular: Normal rate.   Respiratory: Effort normal.  GI:  EFM 150-160s, +accels, no decels, occ mi variables Ctx q 2-4 mins  Musculoskeletal: Normal range of motion.  Neurological: She is alert and oriented to person, place, and time.   Skin: Skin is warm and dry.  Psychiatric: She has a normal mood and affect. Her behavior is normal. Thought content normal.    CBC    Component Value Date/Time   WBC 13.8 (H) 10/06/2016 0131   RBC 3.94 10/06/2016 0131   HGB 11.9 (L) 10/06/2016 0131   HCT 34.9 (L) 10/06/2016 0131   HCT 37.9 07/09/2016 1010   PLT 252 10/06/2016 0131   PLT 269 07/09/2016 1010   MCV 88.6 10/06/2016 0131   MCV 93 07/09/2016 1010   MCH 30.2 10/06/2016 0131   MCHC 34.1 10/06/2016 0131   RDW 13.1 10/06/2016 0131   RDW 13.3 07/09/2016 1010   CMP: K 3.3, cr 0.75, nl LFTs P/C: 0.15  Prenatal labs: ABO, Rh: --/--/A POS (10/18 2019) Antibody:   Rubella:  immune 03/21/16 RPR: Non Reactive (12/27 1010)  HBsAg:   neg 03/21/16 HIV: Non Reactive (12/27 1010)  GBS: Positive (03/05 0920)   Assessment/Plan: IUP@term  Latent labor Elevated BPs GBS pos  [facility] Watch BPs PCN for GBS ppx Anticipate SVD   Leah Pitts 10/06/2016, 2:15 AM

## 2016-10-06 NOTE — Transfer of Care (Signed)
Immediate Anesthesia Transfer of Care Note  Patient: Leah Pitts  Procedure(s) Performed: Procedure(s): CESAREAN SECTION (N/A)  Patient Location: PACU  Anesthesia Type:Epidural  Level of Consciousness: awake, alert  and oriented  Airway & Oxygen Therapy: Patient Spontanous Breathing  Post-op Assessment: Report given to RN and Post -op Vital signs reviewed and stable  Post vital signs: Reviewed and stable  Last Vitals:  Vitals:   10/06/16 1014 10/06/16 1032  BP:  (!) 146/83  Pulse:  73  Resp:  18  Temp: (!) 38.2 C     Last Pain:  Vitals:   10/06/16 1014  TempSrc: Axillary  PainSc:          Complications: No apparent anesthesia complications

## 2016-10-06 NOTE — MAU Note (Signed)
Pt reports contractions and gush of clear fluid. ?SROM. Denies vag bleeding. +FM. Cervix 1.5cm last week.

## 2016-10-06 NOTE — Anesthesia Pain Management Evaluation Note (Signed)
  CRNA Pain Management Visit Note  Patient: Leah Pitts, 21 y.o., female  "Hello I am a member of the anesthesia team at [facility]. We have an anesthesia team available at all times to provide care throughout the hospital, including epidural management and anesthesia for C-section. I don't know your plan for the delivery whether it a natural birth, water birth, IV sedation, nitrous supplementation, doula or epidural, but we want to meet your pain goals."   1.Was your pain managed to your expectations on prior hospitalizations?   No prior hospitalizations  2.What is your expectation for pain management during this hospitalization?     Epidural  3.How can we help you reach that goal? Epidural in place  Record the patient's initial score and the patient's pain goal.   Pain: 5  Pain Goal: 8 [facility] wants you to be able to say your pain was always managed very well.  Calypso Hagarty 10/06/2016

## 2016-10-06 NOTE — Anesthesia Postprocedure Evaluation (Signed)
Anesthesia Post Note  Patient: Leah Pitts  Procedure(s) Performed: Procedure(s) (LRB): CESAREAN SECTION (N/A)  Patient location during evaluation: Mother Baby Anesthesia Type: Epidural Level of consciousness: awake and awake and alert Pain management: pain level controlled Vital Signs Assessment: post-procedure vital signs reviewed and stable Respiratory status: spontaneous breathing Cardiovascular status: blood pressure returned to baseline and stable Postop Assessment: no headache, no backache, spinal receding and adequate PO intake Anesthetic complications: no        Last Vitals:  Vitals:   10/06/16 1444 10/06/16 1550  BP: 139/74 139/89  Pulse: 68 79  Resp: 20 18  Temp: 36.7 C 36.7 C    Last Pain:  Vitals:   10/06/16 1550  TempSrc: Oral  PainSc: Asleep   Pain Goal:                 [facility] Hospital[facility]SHALL,Isaul Landi

## 2016-10-07 ENCOUNTER — Encounter: Payer: Medicaid Other | Admitting: Certified Nurse Midwife

## 2016-10-07 LAB — CBC
HCT: 25 % — ABNORMAL LOW (ref 36.0–46.0)
Hemoglobin: 8.7 g/dL — ABNORMAL LOW (ref 12.0–15.0)
MCH: 31.4 pg (ref 26.0–34.0)
MCHC: 34.8 g/dL (ref 30.0–36.0)
MCV: 90.3 fL (ref 78.0–100.0)
PLATELETS: 194 10*3/uL (ref 150–400)
RBC: 2.77 MIL/uL — ABNORMAL LOW (ref 3.87–5.11)
RDW: 13.1 % (ref 11.5–15.5)
WBC: 23.5 10*3/uL — AB (ref 4.0–10.5)

## 2016-10-07 MED ORDER — POLYSACCHARIDE IRON COMPLEX 150 MG PO CAPS
150.0000 mg | ORAL_CAPSULE | Freq: Two times a day (BID) | ORAL | Status: DC
  Administered 2016-10-07 – 2016-10-08 (×4): 150 mg via ORAL
  Filled 2016-10-07 (×4): qty 1

## 2016-10-07 NOTE — Lactation Note (Signed)
Lactation Consultation Note  Patient Name: Leah Pitts ZOXWR'UToday's Date: 10/07/2016   Mother attempted breastfeeding baby in cross cradle w/ assistance of LC. Baby was sleepy and spitty and did not latch. Reviewed how hand expression drops can help. Assisted w/ keeping baby close STS and encouraged mother to continue to do so. Reminded her to pump q2-3 hours for approx 15 min.     Maternal Data    Feeding    LATCH Score/Interventions                      Lactation Tools Discussed/Used     Consult Status      Hardie PulleyBerkelhammer, Ruth Boschen 10/07/2016, 1:04 PM

## 2016-10-07 NOTE — Lactation Note (Signed)
Lactation Consultation Note  Patient Name: Leah Pitts ZOXWR'UToday's Date: 10/07/2016   Baby in NICU.  Reviewed hand expression w/ mother.  Drops expressed bilaterally. Assisted w/ DEBP.  Recommend mother pump q3 hr for 15 min both breasts. Suggest she hand express before and after pumping.   Discussed milk storage and labeling.     Maternal Data    Feeding    LATCH Score/Interventions                      Lactation Tools Discussed/Used     Consult Status      Hardie PulleyBerkelhammer, Ruth Boschen 10/07/2016, 10:10 AM

## 2016-10-07 NOTE — Progress Notes (Signed)
Subjective: Postpartum Day #1: Cesarean Delivery Patient reports incisional pain, tolerating PO and no problems voiding.    Objective: Vital signs in last 24 hours: Temp:  [97.5 F (36.4 C)-101.8 F (38.8 C)] 97.5 F (36.4 C) (03/27 0553) Pulse Rate:  [67-79] 67 (03/27 0553) Resp:  [16-20] 16 (03/27 0245) BP: (97-154)/(54-89) 121/61 (03/27 0553) SpO2:  [95 %-100 %] 100 % (03/27 0553)  Physical Exam:  General: alert, cooperative and no distress Lochia: appropriate Uterine Fundus: firm Incision: no significant drainage, no dehiscence, no significant erythema DVT Evaluation: No evidence of DVT seen on physical exam. No cords or calf tenderness. No significant calf/ankle edema.   Recent Labs  10/06/16 0131 10/07/16 0601  HGB 11.9* 8.7*  HCT 34.9* 25.0*    Assessment/Plan: Status post Cesarean section. Postoperative course complicated by anemia: iron started, asymptomatic  Continue current care.  Leah Pitts, CNM 10/07/2016, 7:45 AM

## 2016-10-07 NOTE — Plan of Care (Signed)
Problem: Role Relationship: Goal: Ability to demonstrate positive interaction with newborn will improve Patient visits baby in NICU frequently.

## 2016-10-08 NOTE — Progress Notes (Signed)
Subjective: Postpartum Day #2: Cesarean Delivery Patient reports incisional pain, tolerating PO, + flatus and no problems voiding.    Objective: Vital signs in last 24 hours: Temp:  [97.6 F (36.4 C)-98.2 F (36.8 C)] 98.1 F (36.7 C) (03/28 0555) Pulse Rate:  [64-85] 64 (03/28 0555) Resp:  [18] 18 (03/28 0555) BP: (92-130)/(57-79) 130/60 (03/28 0555)  Physical Exam:  General: alert, cooperative and no distress Lochia: appropriate Uterine Fundus: firm Incision: no significant drainage, no dehiscence, no significant erythema DVT Evaluation: No evidence of DVT seen on physical exam. No cords or calf tenderness. No significant calf/ankle edema.   Recent Labs  10/06/16 0131 10/07/16 0601  HGB 11.9* 8.7*  HCT 34.9* 25.0*    Assessment/Plan: Status post Cesarean section. Doing well postoperatively.  Continue current care.  Plan discharge tomorrow.  Infant in NICU doing well.  Abdominal binder ordered.  Anemia: asymptomatic, on iron.   Leah Pitts, CNM 10/08/2016, 7:54 AM

## 2016-10-08 NOTE — Lactation Note (Signed)
This note was copied from a baby's chart. Lactation Consultation Note  Patient Name: Leah Pitts  Mom just returned from NICU and is teary eyed.  She states she isn't obtaining any milk with pumping.  Instructed to continue pumping and hand expressing 8-12 times/24 hours.  Encouraged to call out with concerns/assist prn.   Maternal Data    Feeding Feeding Type: Formula Nipple Type: Slow - flow Length of feed: 15 min  LATCH Score/Interventions                      Lactation Tools Discussed/Used     Consult Status      Huston FoleyMOULDEN, Mcclain Shall S Pitts, 2:47 PM

## 2016-10-09 ENCOUNTER — Ambulatory Visit: Payer: Self-pay

## 2016-10-09 MED ORDER — IBUPROFEN 600 MG PO TABS: 600.0000 mg | ORAL_TABLET | Freq: Four times a day (QID) | ORAL | 0 refills | Status: AC

## 2016-10-09 MED ORDER — POLYSACCHARIDE IRON COMPLEX 150 MG PO CAPS: 150.0000 mg | ORAL_CAPSULE | Freq: Two times a day (BID) | ORAL | 3 refills | Status: AC

## 2016-10-09 MED ORDER — OXYCODONE-ACETAMINOPHEN 5-325 MG PO TABS: 1.0000 | ORAL_TABLET | Freq: Four times a day (QID) | ORAL | 0 refills | Status: AC | PRN

## 2016-10-09 NOTE — Lactation Note (Signed)
This note was copied from a baby's chart. Lactation Consultation Note  Patient Name: Leah Pitts Date: 10/09/2016   NICU baby 58 hours old. Attempted to visit mom twice in her room on MBU, and once in the NICU at the baby's bedside. Mom left pumping kit in room.  Maternal Data    Feeding    LATCH Score/Interventions                      Lactation Tools Discussed/Used     Consult Status      Andres Labrum 10/09/2016, 12:02 PM

## 2016-10-09 NOTE — Discharge Summary (Signed)
OB Discharge Summary     Patient Name: Leah Pitts DOB: 1995/12/23 MRN: [id number]  Date of admission: 10/06/2016 Delivering MD: Horace Bing   Date of discharge: 10/09/2016  Admitting diagnosis: 38 WEEKS ROM CTX Intrauterine pregnancy: [redacted]w[redacted]d     Secondary diagnosis:  Active Problems:   Term pregnancy delivered  Additional problems: anemia     Discharge diagnosis: Term Pregnancy Delivered                                                                                                Post partum procedures:none  Augmentation: Pitocin  Complications: None  Hospital course:  Onset of Labor With Unplanned C/S  21 y.o. yo G1P1001 at [redacted]w[redacted]d was admitted in Latent Labor on 10/06/2016. Patient had a labor course significant for triple I with maternal fever and fetal tachycardia. Membrane Rupture Time/Date: 12:01 AM ,10/06/2016   The patient went for cesarean section due to Non-Reassuring FHR, and delivered a Viable infant,10/06/2016  Details of operation can be found in separate operative note. Patient had an uncomplicated postpartum course.  She is ambulating,tolerating a regular diet, passing flatus, and urinating well.  Patient is discharged home in stable condition 10/09/16.  Physical exam  Vitals:   10/07/16 1044 10/07/16 1749 10/08/16 0555 10/09/16 0522  BP: (!) 92/57 118/79 130/60 127/72  Pulse: 85 83 64 98  Resp: 18 18 18 16   Temp: 97.6 F (36.4 C) 98.2 F (36.8 C) 98.1 F (36.7 C) 99.7 F (37.6 C)  TempSrc:  Oral Oral Oral  SpO2:      Weight:      Height:       General: alert, cooperative and no distress Lochia: appropriate Uterine Fundus: firm Incision: Dressing is clean, dry, and intact DVT Evaluation: 1+ pitting edema bilaterally, no calf tenderness, no cords palpated  Labs: Lab Results  Component Value Date   WBC 23.5 (H) 10/07/2016   HGB 8.7 (L) 10/07/2016   HCT 25.0 (L) 10/07/2016   MCV 90.3 10/07/2016   PLT 194 10/07/2016   CMP Latest Ref Rng &  Units 10/06/2016  Glucose 65 - 99 mg/dL 87  BUN 6 - 20 mg/dL <4(U)  Creatinine 9.81 - 1.00 mg/dL 1.91  Sodium 478 - 295 mmol/L 135  Potassium 3.5 - 5.1 mmol/L 3.3(L)  Chloride 101 - 111 mmol/L 105  CO2 22 - 32 mmol/L 21(L)  Calcium 8.9 - 10.3 mg/dL 6.2(Z)  Total Protein 6.5 - 8.1 g/dL 7.0  Total Bilirubin 0.3 - 1.2 mg/dL 0.8  Alkaline Phos 38 - 126 U/L 174(H)  AST 15 - 41 U/L 41  ALT 14 - 54 U/L 25    Discharge instruction: per After Visit Summary and "Baby and Me Booklet".  After visit meds:  Allergies as of 10/09/2016      Reactions   Peanut-containing Drug Products Anaphylaxis   Latex Itching   Chocolate Rash   Tomato Rash      Medication List    TAKE these medications   acetaminophen 500 MG tablet Commonly known as:  TYLENOL Take 1,000 mg by mouth every 6 (six) hours  as needed for mild pain, moderate pain or headache.   cholecalciferol 1000 units tablet Commonly known as:  VITAMIN D Take 1,000 Units by mouth daily.   ibuprofen 600 MG tablet Commonly known as:  ADVIL,MOTRIN Take 1 tablet (600 mg total) by mouth every 6 (six) hours.   iron polysaccharides 150 MG capsule Commonly known as:  NIFEREX Take 1 capsule (150 mg total) by mouth 2 (two) times daily.   oxyCODONE-acetaminophen 5-325 MG tablet Commonly known as:  ROXICET Take 1-2 tablets by mouth every 6 (six) hours as needed (take 1 tablet for pain scale 4-7, 2 tablets for pain scale 8-10).   prenatal multivitamin Tabs tablet Take 1 tablet by mouth daily.   valACYclovir 500 MG tablet Commonly known as:  VALTREX Take 2 tablets (1,000 mg total) by mouth 2 (two) times daily.       Diet: routine diet  Activity: Advance as tolerated. Pelvic rest for 6 weeks.   Outpatient follow up:6 weeks Follow up Appt:No future appointments. Follow up Visit:No Follow-up on file.  Postpartum contraception: Condoms  Newborn Data: Live born female  Birth Weight: 7 lb 5.3 oz (3325 g) APGAR: 3, 2  Baby Feeding:  Bottle Disposition:NICU   10/09/2016 Charlsie MerlesJulia Rhoden, MD   CNM attestation I have seen and examined this patient and agree with above documentation in the resident's note.   Rada HayDoranna M Gironda is a 21 y.o. G1P1001 s/p pLTCS.   Pain is well controlled.  Plan for birth control is condoms.  Method of Feeding: bottle  PE:  BP 127/72 (BP Location: Left Arm)   Pulse 98   Temp 99.7 F (37.6 C) (Oral)   Resp 16   Ht 5\' 3"  (1.6 m)   Wt 77.1 kg (170 lb)   LMP 01/07/2016 (Approximate)   SpO2 100%   Breastfeeding? Unknown   BMI 30.11 kg/m  Fundus firm   Recent Labs  10/07/16 0601  HGB 8.7*  HCT 25.0*     Plan: discharge today - postpartum care discussed - f/u clinic in 4-6 weeks for postpartum visit   Cam HaiSHAW, Gurdeep Keesey, CNM 7:56 AM  10/09/2016

## 2016-10-09 NOTE — Discharge Instructions (Signed)

## 2017-08-21 IMAGING — US US MFM OB LIMITED
1 series · 15 of 21 positions shown · non-contrast
Comparison: none

[Series 1: us mfm ob limited · 15 of 21 slices shown]
[im 1/21]
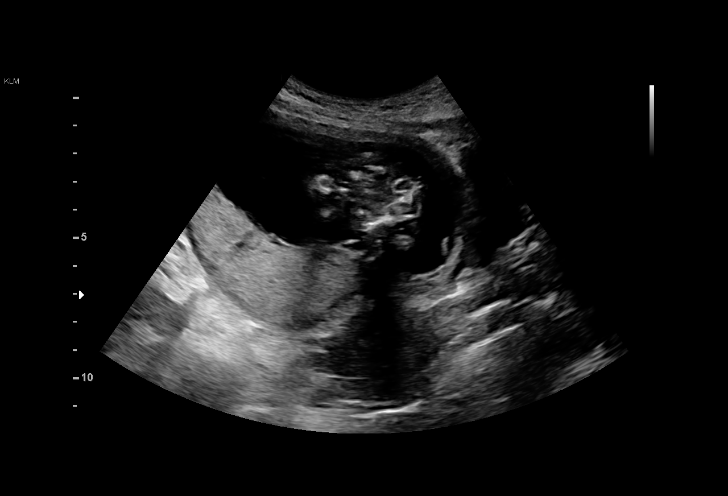
[im 3/21]
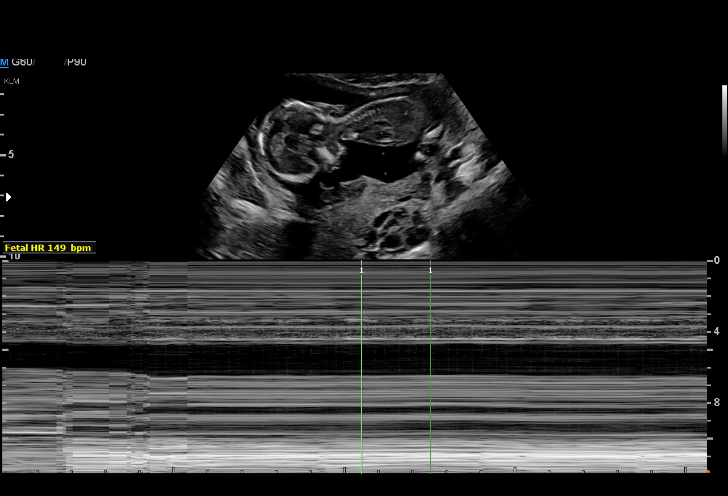
[im 4/21]
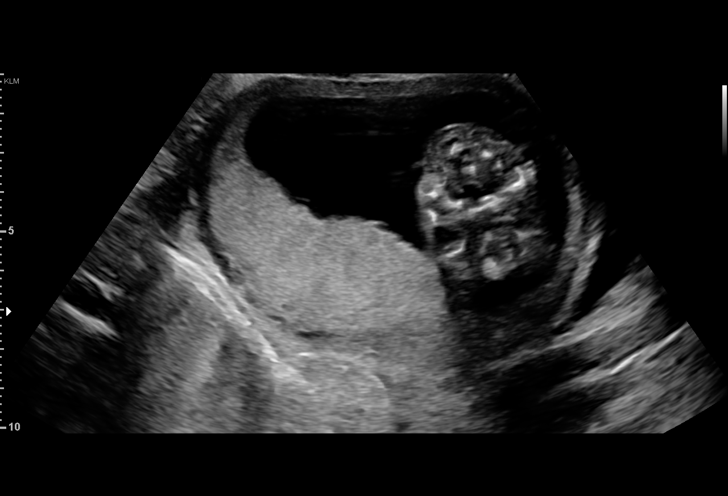
[im 5/21]
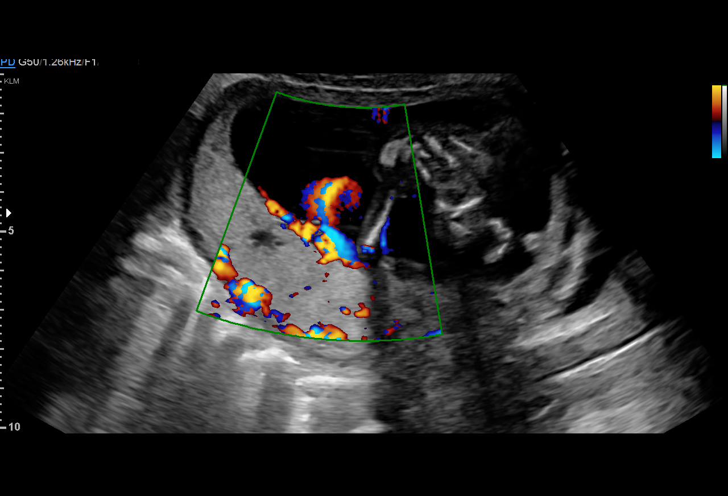
[im 7/21]
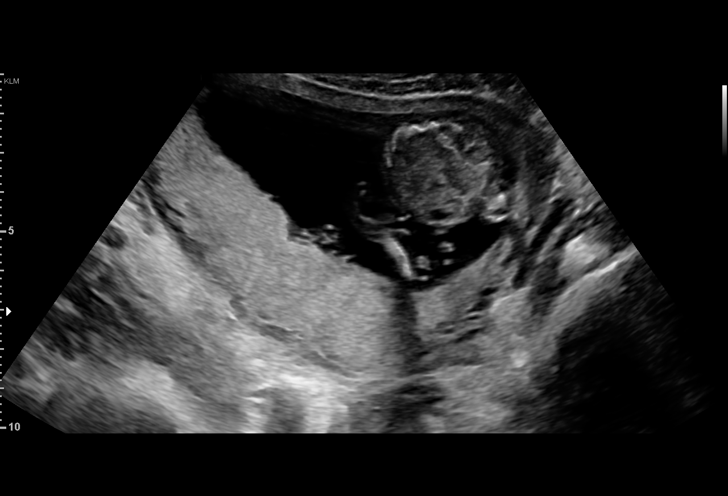
[im 8/21]
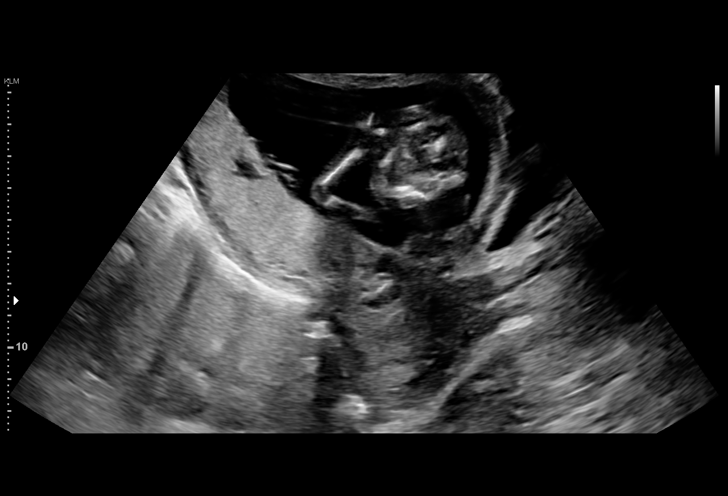
[im 10/21]
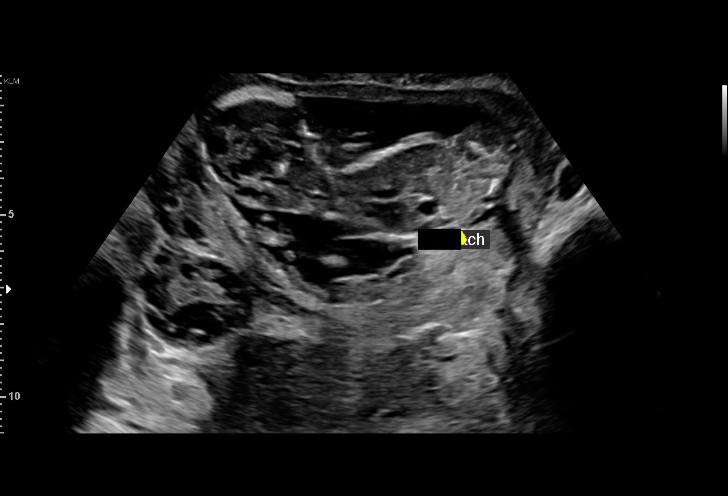
[im 11/21]
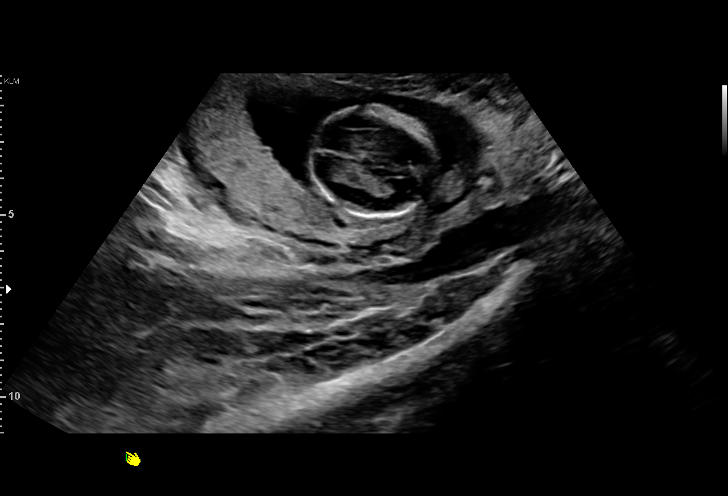
[im 12/21]
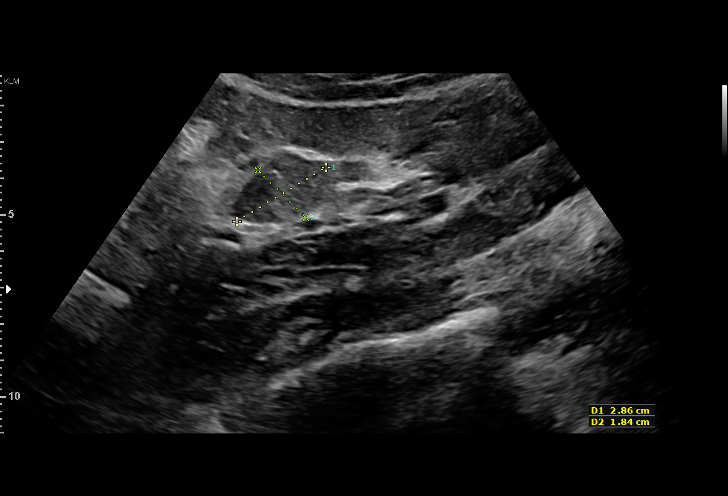
[im 14/21]
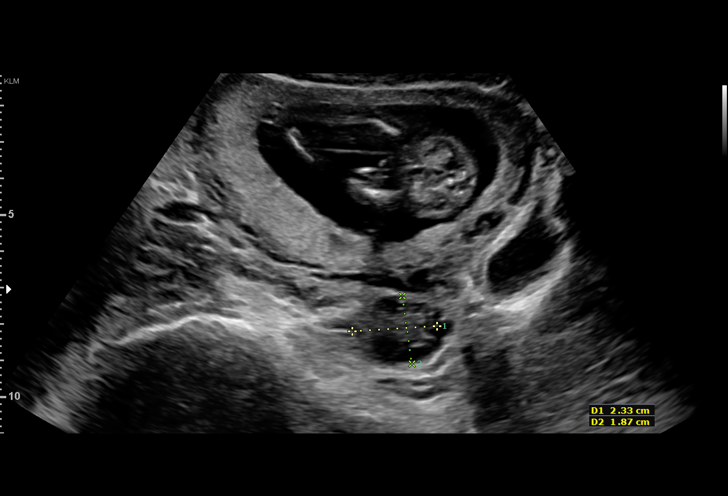
[im 15/21]
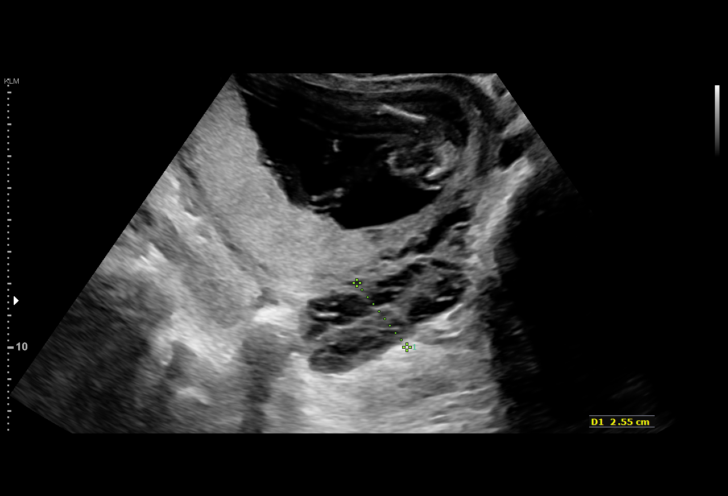
[im 17/21]
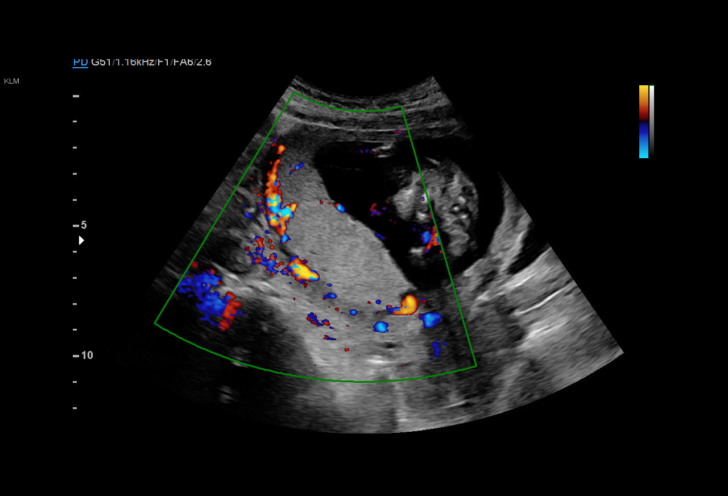
[im 18/21]
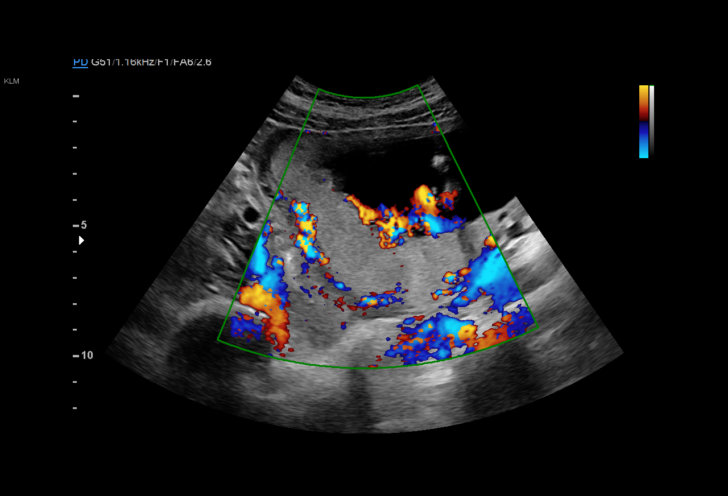
[im 19/21]
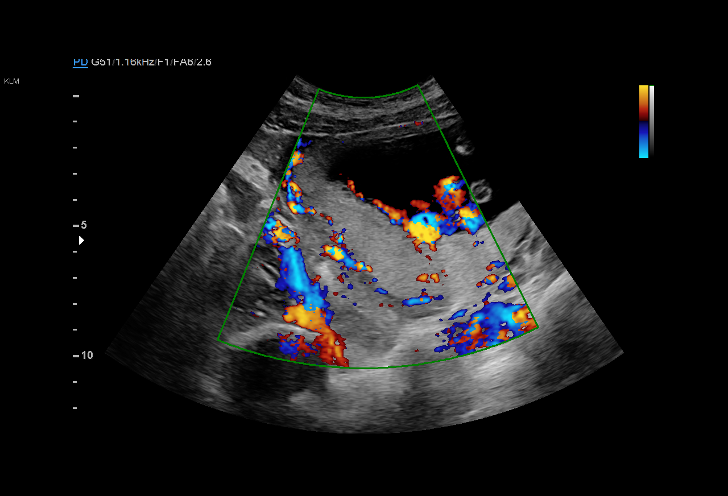
[im 21/21]
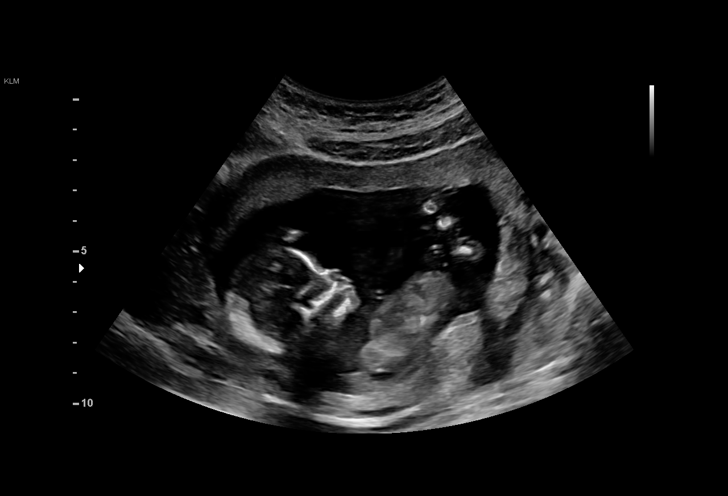

[15 of 21 positions shown; findings below may reference images not displayed]

MAU/Triage
CAITY CNM

1  MANEK VS          [id number]      [id number]     [id number]
Indications

15 weeks gestation of pregnancy
Vaginal bleeding in pregnancy, second
trimester
Abdominal pain in pregnancy
Fetal Evaluation

Num Of Fetuses:     1
Fetal Heart         149
Rate(bpm):
Cardiac Activity:   Observed
Presentation:       Transverse, head to maternal right
Placenta:           Posterior
P. Cord Insertion:  Visualized

Amniotic Fluid
AFI FV:      Subjectively within normal limits
Gestational Age

Best:          15w 6d    Det. By:   Early Ultrasound         EDD:   10/16/16
Cervix Uterus Adnexa

Cervix
Closed

Uterus
No abnormality visualized.
Left Ovary
Within normal limits.

Right Ovary
Within normal limits.

Cul De Sac:   No free fluid seen.

Adnexa:       No abnormality visualized.
Impression

SIUP at 15+6 weeks
Normal amniotic fluid volume
Asymmetric, posterior marginal previa/low-lying placenta; no
subchorionic fluid collections/hemorrhage
Recommendations

Offer anatomy U/S by 18 weeks and reassess placental
location

## 2023-01-19 ENCOUNTER — Emergency Department (HOSPITAL_COMMUNITY): Payer: Medicaid Other

## 2023-01-19 ENCOUNTER — Encounter (HOSPITAL_COMMUNITY): Payer: Self-pay | Admitting: Emergency Medicine

## 2023-01-19 ENCOUNTER — Other Ambulatory Visit: Payer: Self-pay

## 2023-01-19 ENCOUNTER — Emergency Department (HOSPITAL_COMMUNITY)
Admission: EM | Admit: 2023-01-19 | Discharge: 2023-01-19 | Disposition: A | Discharge status: Home / Self Care | Payer: Medicaid Other | Attending: Emergency Medicine | Admitting: Emergency Medicine

## 2023-01-19 DIAGNOSIS — Z91040 Latex allergy status: Secondary | ICD-10-CM | POA: Insufficient documentation

## 2023-01-19 DIAGNOSIS — R0602 Shortness of breath: Secondary | ICD-10-CM | POA: Insufficient documentation

## 2023-01-19 DIAGNOSIS — F17200 Nicotine dependence, unspecified, uncomplicated: Secondary | ICD-10-CM | POA: Diagnosis not present

## 2023-01-19 DIAGNOSIS — R079 Chest pain, unspecified: Secondary | ICD-10-CM | POA: Diagnosis present

## 2023-01-19 LAB — BASIC METABOLIC PANEL
Anion gap: 17 — ABNORMAL HIGH (ref 5–15)
BUN: 9 mg/dL (ref 6–20)
CO2: 24 mmol/L (ref 22–32)
Calcium: 9.4 mg/dL (ref 8.9–10.3)
Chloride: 98 mmol/L (ref 98–111)
Creatinine, Ser: 0.83 mg/dL (ref 0.44–1.00)
GFR, Estimated: >60 mL/min (ref >60)
Glucose, Bld: 105 mg/dL — ABNORMAL HIGH (ref 70–99)
Potassium: 3.6 mmol/L (ref 3.5–5.1)
Sodium: 139 mmol/L (ref 135–145)

## 2023-01-19 LAB — CBC
HCT: 43.7 % (ref 36.0–46.0)
Hemoglobin: 14 g/dL (ref 12.0–15.0)
MCH: 30.3 pg (ref 26.0–34.0)
MCHC: 32 g/dL (ref 30.0–36.0)
MCV: 94.6 fL (ref 80.0–100.0)
Platelets: 252 10*3/uL (ref 150–400)
RBC: 4.62 MIL/uL (ref 3.87–5.11)
RDW: 13.1 % (ref 11.5–15.5)
WBC: 13.8 10*3/uL — ABNORMAL HIGH (ref 4.0–10.5)
nRBC: 0 % (ref 0.0–0.2)

## 2023-01-19 LAB — TROPONIN I (HIGH SENSITIVITY)
Troponin I (High Sensitivity): 5 ng/L (ref <18)
Troponin I (High Sensitivity): 4 ng/L (ref <18)

## 2023-01-19 LAB — D-DIMER, QUANTITATIVE: D-Dimer, Quant: 0.36 ug/mL-FEU (ref 0.00–0.50)

## 2023-01-19 LAB — HCG, SERUM, QUALITATIVE: Preg, Serum: NEGATIVE

## 2023-01-19 MED ORDER — ALBUTEROL SULFATE HFA 108 (90 BASE) MCG/ACT IN AERS
1.0000 | INHALATION_SPRAY | Freq: Four times a day (QID) | RESPIRATORY_TRACT | Status: DC | PRN
  Administered 2023-01-19: 2 via RESPIRATORY_TRACT
  Filled 2023-01-19: qty 6.7

## 2023-01-19 NOTE — ED Triage Notes (Signed)
Chest pain x 3 days after smoking cigarettes and marijuana. Pt states pain comes and goes and stays in center of chest. Denies any SOB.

## 2023-01-19 NOTE — ED Notes (Signed)
Unsuccessful IV and lab attempt x2.  Will inform next shift.

## 2023-01-19 NOTE — ED Provider Notes (Signed)
[place] EMERGENCY DEPARTMENT AT [facility] Provider Note   CSN: [id number] Arrival date & time: 01/19/23  1554     History  Chief Complaint  Patient presents with   Chest Pain    Leah Pitts is a 27 y.o. female, hx of tobacco use, who presents to the ED 2/2 to tight chest pain for the last 3 days that comes and goes and is associated with smoking weed or nicotine. Denies any present SOB, or swelling of extremities.     Home Medications Prior to Admission medications   Medication Sig Start Date End Date Taking? Authorizing Provider  acetaminophen (TYLENOL) 500 MG tablet Take 1,000 mg by mouth every 6 (six) hours as needed for mild pain, moderate pain or headache.    [provider]  cholecalciferol (VITAMIN D) 1000 units tablet Take 1,000 Units by mouth daily.    [provider]  ibuprofen (ADVIL,MOTRIN) 600 MG tablet Take 1 tablet (600 mg total) by mouth every 6 (six) hours. 10/09/16   Genice Rouge, MD  iron polysaccharides (NIFEREX) 150 MG capsule Take 1 capsule (150 mg total) by mouth 2 (two) times daily. 10/09/16   Genice Rouge, MD  oxyCODONE-acetaminophen (ROXICET) 5-325 MG tablet Take 1-2 tablets by mouth every 6 (six) hours as needed (take 1 tablet for pain scale 4-7, 2 tablets for pain scale 8-10). 10/09/16   Genice Rouge, MD  Prenatal Vit-Fe Fumarate-FA (PRENATAL MULTIVITAMIN) TABS tablet Take 1 tablet by mouth daily.    [provider]  valACYclovir (VALTREX) 500 MG tablet Take 2 tablets (1,000 mg total) by mouth 2 (two) times daily. 09/15/16   Allie Bossier, MD      Allergies    Peanut-containing drug products, Latex, Chocolate, and Tomato    Review of Systems   Review of Systems  Respiratory:  Negative for shortness of breath.   Cardiovascular:  Positive for chest pain.    Physical Exam Updated Vital Signs BP (!) 108/57   Pulse 68   Temp 98.1 F (36.7 C)   Resp 13   Ht 5\' 3"  (1.6 m)   Wt 90.7 kg   LMP  12/16/2022 Comment: Irregular periods per pt  SpO2 100%   BMI 35.43 kg/m  Physical Exam Vitals and nursing note reviewed.  Constitutional:      General: She is not in acute distress.    Appearance: She is well-developed.  HENT:     Head: Normocephalic and atraumatic.  Eyes:     Conjunctiva/sclera: Conjunctivae normal.  Cardiovascular:     Rate and Rhythm: Normal rate and regular rhythm.     Heart sounds: No murmur heard. Pulmonary:     Effort: Pulmonary effort is normal. No respiratory distress.     Breath sounds: Normal breath sounds.  Abdominal:     Palpations: Abdomen is soft.     Tenderness: There is no abdominal tenderness.  Musculoskeletal:        General: No swelling.     Cervical back: Neck supple.  Skin:    General: Skin is warm and dry.     Capillary Refill: Capillary refill takes less than 2 seconds.  Neurological:     Mental Status: She is alert.  Psychiatric:        Mood and Affect: Mood normal.     ED Results / Procedures / Treatments   Labs (all labs ordered are listed, but only abnormal results are displayed) Labs Reviewed  BASIC METABOLIC  PANEL - Abnormal; Notable for the following components:      Result Value   Glucose, Bld 105 (*)    Anion gap 17 (*)    All other components within normal limits  CBC - Abnormal; Notable for the following components:   WBC 13.8 (*)    All other components within normal limits  HCG, SERUM, QUALITATIVE  D-DIMER, QUANTITATIVE  TROPONIN I (HIGH SENSITIVITY)  TROPONIN I (HIGH SENSITIVITY)    EKG EKG Interpretation Date/Time:  Monday January 19 2023 16:07:38 EDT Ventricular Rate:  102 PR Interval:  136 QRS Duration:  78 QT Interval:  344 QTC Calculation: 448 R Axis:   83  Text Interpretation: Sinus tachycardia Otherwise normal ECG No previous ECGs available Confirmed by Virgina Norfolk [id number]) on 01/19/2023 6:34:37 PM  Radiology DG Chest 2 View  Result Date: 01/19/2023 CLINICAL DATA:  Intermittent central chest  pain for the past 2 days. EXAM: CHEST - 2 VIEW COMPARISON:  None Available. FINDINGS: The heart size and mediastinal contours are within normal limits. Both lungs are clear. The visualized skeletal structures are unremarkable. IMPRESSION: No active cardiopulmonary disease. Electronically Signed   By: Obie Dredge M.D.   On: 01/19/2023 16:36    Procedures Procedures    Medications Ordered in ED Medications  albuterol (VENTOLIN HFA) 108 (90 Base) MCG/ACT inhaler 1-2 puff (2 puffs Inhalation Given 01/19/23 2138)    ED Course/ Medical Decision Making/ A&P                             Medical Decision Making Patient is a 27 year old female, here for chest pain, and shortness of breath.  Worse when taking deep breath.  We obtained a chest x-ray, D-dimer as she was tachycardic when first entering the ER.  As well as troponins.  Amount and/or Complexity of Data Reviewed Labs: ordered.    Details: D-dimer less than 0.5, troponin within normal limits Radiology: ordered.    Details: Chest x-ray clear Discussion of management or test interpretation with external provider(s): Shortness of breath, chest pain is associated with smoking liter nicotine, I believe that this may be secondary to an inflammatory response.  Her troponins, were negative, as well as her D-dimer level was less than 0.5, low risk for PE.  We will discharge with albuterol inhaler and I discussed tobacco and marijuana cessation.  Return precautions emphasized.  Risk Prescription drug management.    Final Clinical Impression(s) / ED Diagnoses Final diagnoses:  Chest pain, unspecified type    Rx / DC Orders ED Discharge Orders     None         Lyrick Worland, Harley Alto, PA 01/19/23 2212    Virgina Norfolk, DO 01/19/23 2214

## 2023-01-19 NOTE — Discharge Instructions (Signed)
I believe your chest pain is from your smoking. I recommend you stop smoking. Use nicotine patches to help with this. Return to the ED if your symptoms worsen.

## 2023-08-22 ENCOUNTER — Other Ambulatory Visit: Payer: Self-pay

## 2023-08-22 ENCOUNTER — Encounter (HOSPITAL_COMMUNITY): Payer: Self-pay | Admitting: *Deleted

## 2023-08-22 ENCOUNTER — Emergency Department (HOSPITAL_COMMUNITY)
Admission: EM | Admit: 2023-08-22 | Discharge: 2023-08-23 | Discharge status: Against Medical Advice | Payer: Medicaid Other | Attending: Emergency Medicine | Admitting: Emergency Medicine

## 2023-08-22 DIAGNOSIS — R109 Unspecified abdominal pain: Secondary | ICD-10-CM | POA: Diagnosis present

## 2023-08-22 DIAGNOSIS — Z5321 Procedure and treatment not carried out due to patient leaving prior to being seen by health care provider: Secondary | ICD-10-CM | POA: Insufficient documentation

## 2023-08-22 LAB — CBC
HCT: 43.3 % (ref 36.0–46.0)
Hemoglobin: 13.9 g/dL (ref 12.0–15.0)
MCH: 29.8 pg (ref 26.0–34.0)
MCHC: 32.1 g/dL (ref 30.0–36.0)
MCV: 92.9 fL (ref 80.0–100.0)
Platelets: 271 10*3/uL (ref 150–400)
RBC: 4.66 MIL/uL (ref 3.87–5.11)
RDW: 12.7 % (ref 11.5–15.5)
WBC: 13 10*3/uL — ABNORMAL HIGH (ref 4.0–10.5)
nRBC: 0 % (ref 0.0–0.2)

## 2023-08-22 LAB — COMPREHENSIVE METABOLIC PANEL
ALT: 15 U/L (ref 0–44)
AST: 19 U/L (ref 15–41)
Albumin: 4.2 g/dL (ref 3.5–5.0)
Alkaline Phosphatase: 53 U/L (ref 38–126)
Anion gap: 14 (ref 5–15)
BUN: 12 mg/dL (ref 6–20)
CO2: 23 mmol/L (ref 22–32)
Calcium: 9.6 mg/dL (ref 8.9–10.3)
Chloride: 100 mmol/L (ref 98–111)
Creatinine, Ser: 0.89 mg/dL (ref 0.44–1.00)
GFR, Estimated: >60 mL/min (ref >60)
Glucose, Bld: 116 mg/dL — ABNORMAL HIGH (ref 70–99)
Potassium: 3.4 mmol/L — ABNORMAL LOW (ref 3.5–5.1)
Sodium: 137 mmol/L (ref 135–145)
Total Bilirubin: 0.3 mg/dL (ref 0.0–1.2)
Total Protein: 8.2 g/dL — ABNORMAL HIGH (ref 6.5–8.1)

## 2023-08-22 LAB — URINALYSIS, ROUTINE W REFLEX MICROSCOPIC
Bacteria, UA: NONE SEEN
Bilirubin Urine: NEGATIVE
Glucose, UA: NEGATIVE mg/dL
Hgb urine dipstick: NEGATIVE
Ketones, ur: NEGATIVE mg/dL
Leukocytes,Ua: NEGATIVE
Nitrite: NEGATIVE
Protein, ur: 30 mg/dL — AB
Specific Gravity, Urine: 1.027 (ref 1.005–1.030)
pH: 5 (ref 5.0–8.0)

## 2023-08-22 LAB — HCG, SERUM, QUALITATIVE: Preg, Serum: NEGATIVE

## 2023-08-22 LAB — LIPASE, BLOOD: Lipase: 24 U/L (ref 11–51)

## 2023-08-22 NOTE — ED Triage Notes (Signed)
 The pt has had abd pain for 30 minutes no n v or diarrhea  lmp one week ago

## 2023-08-23 NOTE — ED Notes (Signed)
Pt called X3 to go to a room. Pt could not be found.
